# Patient Record
Sex: Male | Born: 1960 | Race: White | Hispanic: No
Health system: Southern US, Community
[De-identification: ages and names within clinical notes are randomized; demographics above are authoritative.]

## PROBLEM LIST (undated history)

## (undated) DIAGNOSIS — G9341 Metabolic encephalopathy: Secondary | ICD-10-CM

## (undated) DIAGNOSIS — J90 Pleural effusion, not elsewhere classified: Secondary | ICD-10-CM

## (undated) DIAGNOSIS — I469 Cardiac arrest, cause unspecified: Secondary | ICD-10-CM

## (undated) DIAGNOSIS — F1019 Alcohol abuse with unspecified alcohol-induced disorder: Secondary | ICD-10-CM

## (undated) DIAGNOSIS — J9621 Acute and chronic respiratory failure with hypoxia: Secondary | ICD-10-CM

---

## 2018-05-30 ENCOUNTER — Other Ambulatory Visit (HOSPITAL_COMMUNITY): Payer: Self-pay

## 2018-05-30 ENCOUNTER — Inpatient Hospital Stay
Admission: RE | Admit: 2018-05-30 | Discharge: 2018-07-11 | Disposition: E | Payer: Medicare Other | Source: Other Acute Inpatient Hospital | Attending: Internal Medicine | Admitting: Internal Medicine

## 2018-05-30 DIAGNOSIS — J9621 Acute and chronic respiratory failure with hypoxia: Secondary | ICD-10-CM | POA: Diagnosis present

## 2018-05-30 DIAGNOSIS — J9690 Respiratory failure, unspecified, unspecified whether with hypoxia or hypercapnia: Secondary | ICD-10-CM

## 2018-05-30 DIAGNOSIS — G9341 Metabolic encephalopathy: Secondary | ICD-10-CM | POA: Diagnosis present

## 2018-05-30 DIAGNOSIS — J90 Pleural effusion, not elsewhere classified: Secondary | ICD-10-CM | POA: Diagnosis present

## 2018-05-30 DIAGNOSIS — Z4659 Encounter for fitting and adjustment of other gastrointestinal appliance and device: Secondary | ICD-10-CM

## 2018-05-30 DIAGNOSIS — N170 Acute kidney failure with tubular necrosis: Secondary | ICD-10-CM

## 2018-05-30 DIAGNOSIS — I469 Cardiac arrest, cause unspecified: Secondary | ICD-10-CM | POA: Diagnosis present

## 2018-05-30 DIAGNOSIS — K567 Ileus, unspecified: Secondary | ICD-10-CM

## 2018-05-30 DIAGNOSIS — T85598A Other mechanical complication of other gastrointestinal prosthetic devices, implants and grafts, initial encounter: Secondary | ICD-10-CM

## 2018-05-30 DIAGNOSIS — K7460 Unspecified cirrhosis of liver: Secondary | ICD-10-CM

## 2018-05-30 DIAGNOSIS — F1019 Alcohol abuse with unspecified alcohol-induced disorder: Secondary | ICD-10-CM | POA: Diagnosis present

## 2018-05-30 DIAGNOSIS — R188 Other ascites: Secondary | ICD-10-CM

## 2018-05-30 HISTORY — DX: Acute and chronic respiratory failure with hypoxia: J96.21

## 2018-05-30 HISTORY — DX: Metabolic encephalopathy: G93.41

## 2018-05-30 HISTORY — DX: Cardiac arrest, cause unspecified: I46.9

## 2018-05-30 HISTORY — DX: Pleural effusion, not elsewhere classified: J90

## 2018-05-30 HISTORY — DX: Alcohol abuse with unspecified alcohol-induced disorder: F10.19

## 2018-05-31 ENCOUNTER — Other Ambulatory Visit (HOSPITAL_COMMUNITY): Payer: Self-pay

## 2018-05-31 DIAGNOSIS — G9341 Metabolic encephalopathy: Secondary | ICD-10-CM | POA: Diagnosis not present

## 2018-05-31 DIAGNOSIS — F1019 Alcohol abuse with unspecified alcohol-induced disorder: Secondary | ICD-10-CM | POA: Diagnosis not present

## 2018-05-31 DIAGNOSIS — J9621 Acute and chronic respiratory failure with hypoxia: Secondary | ICD-10-CM | POA: Diagnosis not present

## 2018-05-31 DIAGNOSIS — N170 Acute kidney failure with tubular necrosis: Secondary | ICD-10-CM | POA: Diagnosis not present

## 2018-05-31 LAB — CBC WITH DIFFERENTIAL/PLATELET
Abs Immature Granulocytes: 0.03 10*3/uL (ref 0.00–0.07)
Basophils Absolute: 0 10*3/uL (ref 0.0–0.1)
Basophils Relative: 0 %
Eosinophils Absolute: 0.1 10*3/uL (ref 0.0–0.5)
Eosinophils Relative: 1 %
HCT: 28.4 % — ABNORMAL LOW (ref 39.0–52.0)
Hemoglobin: 8.8 g/dL — ABNORMAL LOW (ref 13.0–17.0)
Immature Granulocytes: 1 %
Lymphocytes Relative: 14 %
Lymphs Abs: 0.7 10*3/uL (ref 0.7–4.0)
MCH: 30.2 pg (ref 26.0–34.0)
MCHC: 31 g/dL (ref 30.0–36.0)
MCV: 97.6 fL (ref 80.0–100.0)
Monocytes Absolute: 0.6 10*3/uL (ref 0.1–1.0)
Monocytes Relative: 11 %
Neutro Abs: 3.8 10*3/uL (ref 1.7–7.7)
Neutrophils Relative %: 73 %
Platelets: 93 10*3/uL — ABNORMAL LOW (ref 150–400)
RBC: 2.91 MIL/uL — ABNORMAL LOW (ref 4.22–5.81)
RDW: 17.4 % — ABNORMAL HIGH (ref 11.5–15.5)
WBC: 5.2 10*3/uL (ref 4.0–10.5)
nRBC: 0 % (ref 0.0–0.2)

## 2018-05-31 LAB — COMPREHENSIVE METABOLIC PANEL
ALT: 19 U/L (ref 0–44)
AST: 33 U/L (ref 15–41)
Albumin: 2.3 g/dL — ABNORMAL LOW (ref 3.5–5.0)
Alkaline Phosphatase: 115 U/L (ref 38–126)
Anion gap: 7 (ref 5–15)
BUN: 23 mg/dL — ABNORMAL HIGH (ref 6–20)
CO2: 31 mmol/L (ref 22–32)
Calcium: 8.8 mg/dL — ABNORMAL LOW (ref 8.9–10.3)
Chloride: 105 mmol/L (ref 98–111)
Creatinine, Ser: 1.13 mg/dL (ref 0.61–1.24)
GFR calc Af Amer: >60 mL/min (ref >60)
GFR calc non Af Amer: >60 mL/min (ref >60)
Glucose, Bld: 141 mg/dL — ABNORMAL HIGH (ref 70–99)
Potassium: 4.2 mmol/L (ref 3.5–5.1)
Sodium: 143 mmol/L (ref 135–145)
Total Bilirubin: 1.7 mg/dL — ABNORMAL HIGH (ref 0.3–1.2)
Total Protein: 5.7 g/dL — ABNORMAL LOW (ref 6.5–8.1)

## 2018-05-31 LAB — HEMOGLOBIN A1C
Hgb A1c MFr Bld: 5.7 % — ABNORMAL HIGH (ref 4.8–5.6)
Mean Plasma Glucose: 116.89 mg/dL

## 2018-05-31 LAB — MAGNESIUM: Magnesium: 1.2 mg/dL — ABNORMAL LOW (ref 1.7–2.4)

## 2018-05-31 LAB — AMMONIA: Ammonia: 56 umol/L — ABNORMAL HIGH (ref 9–35)

## 2018-05-31 LAB — C DIFFICILE QUICK SCREEN W PCR REFLEX
C Diff antigen: NEGATIVE
C Diff interpretation: NOT DETECTED
C Diff toxin: NEGATIVE

## 2018-05-31 LAB — PROTIME-INR
INR: 1.17
Prothrombin Time: 14.8 seconds (ref 11.4–15.2)

## 2018-05-31 LAB — PHOSPHORUS: Phosphorus: 2.4 mg/dL — ABNORMAL LOW (ref 2.5–4.6)

## 2018-05-31 LAB — TSH: TSH: 5.629 u[IU]/mL — ABNORMAL HIGH (ref 0.350–4.500)
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
## 2018-06-01 ENCOUNTER — Other Ambulatory Visit (HOSPITAL_COMMUNITY): Payer: Self-pay

## 2018-06-01 ENCOUNTER — Encounter: Payer: Self-pay | Admitting: Internal Medicine

## 2018-06-01 DIAGNOSIS — N170 Acute kidney failure with tubular necrosis: Secondary | ICD-10-CM | POA: Diagnosis not present

## 2018-06-01 DIAGNOSIS — G9341 Metabolic encephalopathy: Secondary | ICD-10-CM | POA: Diagnosis not present

## 2018-06-01 DIAGNOSIS — J9621 Acute and chronic respiratory failure with hypoxia: Secondary | ICD-10-CM | POA: Diagnosis not present

## 2018-06-01 DIAGNOSIS — F1019 Alcohol abuse with unspecified alcohol-induced disorder: Secondary | ICD-10-CM | POA: Diagnosis not present

## 2018-06-01 LAB — CBC
HCT: 29.4 % — ABNORMAL LOW (ref 39.0–52.0)
Hemoglobin: 8.9 g/dL — ABNORMAL LOW (ref 13.0–17.0)
MCH: 29.6 pg (ref 26.0–34.0)
MCHC: 30.3 g/dL (ref 30.0–36.0)
MCV: 97.7 fL (ref 80.0–100.0)
Platelets: 102 10*3/uL — ABNORMAL LOW (ref 150–400)
RBC: 3.01 MIL/uL — ABNORMAL LOW (ref 4.22–5.81)
RDW: 17.3 % — ABNORMAL HIGH (ref 11.5–15.5)
WBC: 3.6 10*3/uL — ABNORMAL LOW (ref 4.0–10.5)
nRBC: 0 % (ref 0.0–0.2)

## 2018-06-01 LAB — MAGNESIUM: Magnesium: 1.7 mg/dL (ref 1.7–2.4)

## 2018-06-01 LAB — RENAL FUNCTION PANEL
Albumin: 2.4 g/dL — ABNORMAL LOW (ref 3.5–5.0)
Anion gap: 8 (ref 5–15)
BUN: 24 mg/dL — ABNORMAL HIGH (ref 6–20)
CO2: 31 mmol/L (ref 22–32)
Calcium: 8.7 mg/dL — ABNORMAL LOW (ref 8.9–10.3)
Chloride: 103 mmol/L (ref 98–111)
Creatinine, Ser: 1.24 mg/dL (ref 0.61–1.24)
GFR calc Af Amer: >60 mL/min (ref >60)
GFR calc non Af Amer: >60 mL/min (ref >60)
Glucose, Bld: 194 mg/dL — ABNORMAL HIGH (ref 70–99)
PHOSPHORUS: 3.7 mg/dL (ref 2.5–4.6)
POTASSIUM: 4 mmol/L (ref 3.5–5.1)
Sodium: 142 mmol/L (ref 135–145)

## 2018-06-01 NOTE — Progress Notes (Signed)
[facility] [place]   PULMONARY CRITICAL CARE SERVICE  PROGRESS NOTE  Date of Service: 06/01/2018  Nathan RathkeJeffrey D Urick  UJW:[id number]  DOB: 1960-07-11   DOA: 05/21/2018  Referring Physician: Carron CurieAli Hijazi, MD  HPI: Nathan Short is a 57 y.o. male seen for follow up of Acute on Chronic Respiratory Failure.  Patient has been attempted on weaning but has not been tolerating it.  Is currently on assist control oxygen was decreased down to 40%.  Medications: Reviewed on Rounds  Physical Exam:  Vitals: Temperature 97.5 pulse 80 respiratory rate 12 blood pressure 114/66 saturations 96%  Ventilator Settings mode of ventilation assist control FiO2 40% tidal volume is 400 PEEP 5  . General: Comfortable at this time . Eyes: Grossly normal lids, irises & conjunctiva . ENT: grossly tongue is normal . Neck: no obvious mass . Cardiovascular: S1 S2 normal no gallop . Respiratory: Coarse breath sounds with few scattered rhonchi . Abdomen: soft . Skin: no rash seen on limited exam . Musculoskeletal: not rigid . Psychiatric:unable to assess . Neurologic: no seizure no involuntary movements         Lab Data:   Basic Metabolic Panel: Recent Labs  Lab 05/31/18 0712 06/01/18 0646  NA 143 142  K 4.2 4.0  CL 105 103  CO2 31 31  GLUCOSE 141* 194*  BUN 23* 24*  CREATININE 1.13 1.24  CALCIUM 8.8* 8.7*  MG 1.2* 1.7  PHOS 2.4* 3.7    ABG: No results for input(s): PHART, PCO2ART, PO2ART, HCO3, O2SAT in the last 168 hours.  Liver Function Tests: Recent Labs  Lab 05/31/18 0712 06/01/18 0646  AST 33  --   ALT 19  --   ALKPHOS 115  --   BILITOT 1.7*  --   PROT 5.7*  --   ALBUMIN 2.3* 2.4*   No results for input(s): LIPASE, AMYLASE in the last 168 hours. Recent Labs  Lab 05/31/18 0712  AMMONIA 56*    CBC: Recent Labs  Lab 05/31/18 0712 06/01/18 0646  WBC 5.2 3.6*  NEUTROABS 3.8  --   HGB 8.8* 8.9*  HCT 28.4* 29.4*  MCV  97.6 97.7  PLT 93* 102*    Cardiac Enzymes: No results for input(s): CKTOTAL, CKMB, CKMBINDEX, TROPONINI in the last 168 hours.  BNP (last 3 results) No results for input(s): BNP in the last 8760 hours.  ProBNP (last 3 results) No results for input(s): PROBNP in the last 8760 hours.  Radiological Exams: Kore[place] Abdomen Complete  Result Date: 05/31/2018 CLINICAL DATA:  Cirrhosis.  Acute respiratory failure. EXAM: ABDOMEN ULTRASOUND COMPLETE COMPARISON:  None. FINDINGS: Degradation secondary to morbid obesity and being on ventilator. Gallbladder: 1.4 cm gallstone. No wall thickening or pericholecystic fluid. Sonographic Murphy's sign was not elicited. Common bile duct: Diameter: Normal, 4 mm. Liver: Increased echogenicity. Mildly irregular hepatic capsule. No focal liver lesion. Portal vein is patent on color Doppler imaging with normal direction of blood flow towards the liver. IVC: No abnormality visualized. Pancreas: Poorly visualized due to overlying bowel gas. Spleen: Size and appearance within normal limits. Right Kidney: Length: 10.6 cm. Echogenicity within normal limits. No mass or hydronephrosis visualized. Left Kidney: Not visualized Abdominal aorta: No aneurysm visualized. Other findings: Small volume perihepatic ascites. This may be complex, including on image 27. IMPRESSION: 1. Decreased sensitivity and specificity exam due to technique related factors, as described above. 2. Increased hepatic echogenicity and irregular hepatic capsule, suspicious for cirrhosis. 3. Cholelithiasis. 4. Small  volume perihepatic ascites. Cannot exclude complex ascites as can be seen with infection. Electronically Signed   By: Jeronimo GreavesKyle  Talbot M.D.   On: 05/31/2018 02:17   Dg Chest Port 1 View  Result Date: 05/16/2018 CLINICAL DATA:  Tracheostomy EXAM: PORTABLE CHEST 1 VIEW COMPARISON:  None. FINDINGS: Tracheostomy tube tip is about 3 cm superior to the carina. Subsegmental atelectasis at the right lung base.  Probable left pleural effusion. Borderline to mild cardiomegaly with vascular congestion and mild pulmonary edema. Dense airspace disease at the left lung base. Esophageal tube tip extends below diaphragm but is non included IMPRESSION: 1. Tracheostomy tube with the tip about 3 cm superior to carina 2. Left pleural effusion. Borderline cardiomegaly with vascular congestion and mild edema. 3. Dense left lung base atelectasis or pneumonia. Platelike atelectasis in the right lower lung Electronically Signed   By: Jasmine PangKim  Fujinaga M.D.   On: 05/26/2018 20:32   Dg Abd Portable 1v  Result Date: 06/01/2018 CLINICAL DATA:  Umbilical pain and abdominal distention. EXAM: PORTABLE ABDOMEN - 1 VIEW COMPARISON:  None. FINDINGS:.: FINDINGS:. Enteric tube tip is in the stomach. The tip projects over the gastric antrum. A few borderline enlarged small bowel loops noted within the right abdomen. The degree of bowel distention is improved from 05/16/2018. Gas is noted throughout the colon up to the rectum no radio-opaque calculi or other significant radiographic abnormality are seen. IMPRESSION: Mildly distended loops of small bowel in the right hemiabdomen. The overall degree of small-bowel distention is improved when compared with the previous exam. Electronically Signed   By: Signa Kellaylor  Stroud M.D.   On: 06/01/2018 13:48   Dg Abd Portable 1v  Result Date: 06/08/2018 CLINICAL DATA:  NG tube placement EXAM: PORTABLE ABDOMEN - 1 VIEW COMPARISON:  None. FINDINGS: Esophageal tube tip and side-port project over the proximal stomach. Dilated small bowel in the left upper quadrant measuring up to 4.5 cm. Evidence of hernia repair. IMPRESSION: Esophageal tube tip and side-port project over the proximal stomach Electronically Signed   By: Jasmine PangKim  Fujinaga M.D.   On: 05/18/2018 20:33    Assessment/Plan Active Problems:   Acute on chronic respiratory failure with hypoxia (HCC)   Acute metabolic encephalopathy   Cardiac arrest (HCC)    Pleural effusion   Acute renal failure with acute tubular necrosis superimposed on chronic kidney disease, on chronic dialysis (HCC)   Alcohol abuse with alcohol-induced disorder (HCC)   1. Acute on chronic respiratory failure with hypoxia as noted above patient has been attempted on weaning trials but is not tolerating them.  Respiratory therapy will continue to do the SBT.  Will assess daily continue with supportive care 2. Pleural effusion chest x-ray shows a pleural effusion on the left side I have asked for a CT scan to be done to further evaluate. 3. Acute metabolic encephalopathy little bit better we will continue to monitor closely. 4. Cardiac arrest rhythm is been stable we will continue to monitor. 5. Acute renal failure follow-up on labs 6. Alcohol abuse no active withdrawal is noted at this time.   I have personally seen and evaluated the patient, evaluated laboratory and imaging results, formulated the assessment and plan and placed orders. The Patient requires high complexity decision making for assessment and support.  Case was discussed on Rounds with the Respiratory Therapy Staff  Yevonne PaxSaadat A Khan, MD [facility]ahFCCP Pulmonary Critical Care Medicine Sleep Medicine

## 2018-06-02 ENCOUNTER — Other Ambulatory Visit (HOSPITAL_COMMUNITY): Payer: Self-pay

## 2018-06-02 DIAGNOSIS — G9341 Metabolic encephalopathy: Secondary | ICD-10-CM | POA: Diagnosis not present

## 2018-06-02 DIAGNOSIS — F1019 Alcohol abuse with unspecified alcohol-induced disorder: Secondary | ICD-10-CM

## 2018-06-02 DIAGNOSIS — N170 Acute kidney failure with tubular necrosis: Secondary | ICD-10-CM | POA: Diagnosis not present

## 2018-06-02 DIAGNOSIS — Z992 Dependence on renal dialysis: Secondary | ICD-10-CM

## 2018-06-02 DIAGNOSIS — J90 Pleural effusion, not elsewhere classified: Secondary | ICD-10-CM

## 2018-06-02 DIAGNOSIS — J9621 Acute and chronic respiratory failure with hypoxia: Secondary | ICD-10-CM

## 2018-06-02 DIAGNOSIS — I469 Cardiac arrest, cause unspecified: Secondary | ICD-10-CM

## 2018-06-02 DIAGNOSIS — N189 Chronic kidney disease, unspecified: Secondary | ICD-10-CM

## 2018-06-02 LAB — CULTURE, RESPIRATORY W GRAM STAIN

## 2018-06-02 LAB — CULTURE, RESPIRATORY

## 2018-06-02 NOTE — Progress Notes (Signed)
[facility]   PULMONARY CRITICAL CARE SERVICE  PROGRESS NOTE  Date of Service: 06/02/2018  Nathan Short  AVW:[id number]  DOB: 04/08/1961   DOA: 05/13/2018  Referring Physician: Carron CurieAli Hijazi, MD  HPI: Nathan Short is a 57 y.o. male seen for follow up of Acute on Chronic Respiratory Failure.  Patient is on full support has been on pressure support weaning today is back on the pressure support goal of 6 hours  Medications: Reviewed on Rounds  Physical Exam:  Vitals: Pitcher 97.6 pulse 110 respiratory 19 blood pressure 118/70 saturations 97%  Ventilator Settings pressure support FiO2 28% tidal volume 500 pressure support 12 PEEP 5  . General: Comfortable at this time . Eyes: Grossly normal lids, irises & conjunctiva . ENT: grossly tongue is normal . Neck: no obvious mass . Cardiovascular: S1 S2 normal no gallop . Respiratory: No rhonchi no rales are noted at this time . Abdomen: soft . Skin: no rash seen on limited exam . Musculoskeletal: not rigid . Psychiatric:unable to assess . Neurologic: no seizure no involuntary movements         Lab Data:   Basic Metabolic Panel: Recent Labs  Lab 05/31/18 0712 06/01/18 0646  NA 143 142  K 4.2 4.0  CL 105 103  CO2 31 31  GLUCOSE 141* 194*  BUN 23* 24*  CREATININE 1.13 1.24  CALCIUM 8.8* 8.7*  MG 1.2* 1.7  PHOS 2.4* 3.7    ABG: No results for input(s): PHART, PCO2ART, PO2ART, HCO3, O2SAT in the last 168 hours.  Liver Function Tests: Recent Labs  Lab 05/31/18 0712 06/01/18 0646  AST 33  --   ALT 19  --   ALKPHOS 115  --   BILITOT 1.7*  --   PROT 5.7*  --   ALBUMIN 2.3* 2.4*   No results for input(s): LIPASE, AMYLASE in the last 168 hours. Recent Labs  Lab 05/31/18 0712  AMMONIA 56*    CBC: Recent Labs  Lab 05/31/18 0712 06/01/18 0646  WBC 5.2 3.6*  NEUTROABS 3.8  --   HGB 8.8* 8.9*  HCT 28.4* 29.4*  MCV 97.6 97.7  PLT 93* 102*     Cardiac Enzymes: No results for input(s): CKTOTAL, CKMB, CKMBINDEX, TROPONINI in the last 168 hours.  BNP (last 3 results) No results for input(s): BNP in the last 8760 hours.  ProBNP (last 3 results) No results for input(s): PROBNP in the last 8760 hours.  Radiological Exams: Ct Chest Wo Contrast  Result Date: 06/02/2018 CLINICAL DATA:  Follow-up pleural effusion EXAM: CT CHEST WITHOUT CONTRAST TECHNIQUE: Multidetector CT imaging of the chest was performed following the standard protocol without IV contrast. COMPARISON:  05/14/2018 FINDINGS: Cardiovascular: Somewhat limited due to lack of IV contrast. Atherosclerotic calcifications are noted as well as coronary calcifications. No cardiac enlargement is seen. No aneurysmal dilatation of the aorta is noted. Mediastinum/Nodes: Thoracic inlet is within normal limits. Tracheostomy tube is noted in satisfactory position. Nasogastric catheter courses into the stomach. No sizable hilar or mediastinal adenopathy is noted. Lungs/Pleura: Patchy consolidation is noted in the right upper and right lower lobes with small pleural effusion. Large left-sided pleural effusion is noted similar to that seen on recent plain film with associated lower lobe consolidation. Upper Abdomen: Cirrhotic change of the liver is noted. Underlying ascites and splenomegaly is noted as well. Musculoskeletal: Degenerative changes of the thoracic spine are noted. IMPRESSION: Bilateral areas of consolidation right greater than left. Bilateral pleural  effusions left greater than right. 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 Electronically Signed   By: Alcide CleverMark  Lukens M.D.   On: 06/02/2018 02:31   Dg Abd Portable 1v  Result Date: 06/01/2018 CLINICAL DATA:  Umbilical pain and abdominal distention. EXAM: PORTABLE ABDOMEN - 1 VIEW COMPARISON:  None. FINDINGS:.: FINDINGS:. Enteric tube tip is in the stomach. The tip projects over the gastric antrum. A few borderline  enlarged small bowel loops noted within the right abdomen. The degree of bowel distention is improved from 2018-04-06. Gas is noted throughout the colon up to the rectum no radio-opaque calculi or other significant radiographic abnormality are seen. IMPRESSION: Mildly distended loops of small bowel in the right hemiabdomen. The overall degree of small-bowel distention is improved when compared with the previous exam. Electronically Signed   By: Signa Kellaylor  Stroud M.D.   On: 06/01/2018 13:48    Assessment/Plan Active Problems:   Acute on chronic respiratory failure with hypoxia (HCC)   Acute metabolic encephalopathy   Cardiac arrest (HCC)   Pleural effusion   Acute renal failure with acute tubular necrosis superimposed on chronic kidney disease, on chronic dialysis (HCC)   Alcohol abuse with alcohol-induced disorder (HCC)   1. Acute on chronic respiratory failure with hypoxia at this time we will continue with weaning on pressure support mode the goal is 6 hours as noted.  We will continue aggressive pulmonary toilet supportive care. 2. Pleural effusion patient will likely need thoracentesis.  Discussed with the primary care team during rounds. 3. Cardiac arrest rhythm is stable we will continue to monitor. 4. Acute renal failure follow-up on labs as noted 5. Alcohol abuse no active withdrawal is noted at this time. 6. Metabolic encephalopathy patient is doing about the same we will continue to monitor   I have personally seen and evaluated the patient, evaluated laboratory and imaging results, formulated the assessment and plan and placed orders. The Patient requires high complexity decision making for assessment and support.  Case was discussed on Rounds with the Respiratory Therapy Staff  Yevonne PaxSaadat A Ivan Maskell, MD [facility] BayFCCP Pulmonary Critical Care Medicine Sleep Medicine

## 2018-06-03 ENCOUNTER — Other Ambulatory Visit (HOSPITAL_COMMUNITY): Payer: Self-pay

## 2018-06-03 DIAGNOSIS — J9621 Acute and chronic respiratory failure with hypoxia: Secondary | ICD-10-CM | POA: Diagnosis not present

## 2018-06-03 DIAGNOSIS — N170 Acute kidney failure with tubular necrosis: Secondary | ICD-10-CM | POA: Diagnosis not present

## 2018-06-03 DIAGNOSIS — F1019 Alcohol abuse with unspecified alcohol-induced disorder: Secondary | ICD-10-CM | POA: Diagnosis not present

## 2018-06-03 DIAGNOSIS — G9341 Metabolic encephalopathy: Secondary | ICD-10-CM | POA: Diagnosis not present

## 2018-06-03 LAB — RENAL FUNCTION PANEL
ALBUMIN: 2.3 g/dL — AB (ref 3.5–5.0)
Anion gap: 11 (ref 5–15)
BUN: 21 mg/dL — ABNORMAL HIGH (ref 6–20)
CO2: 31 mmol/L (ref 22–32)
Calcium: 8.4 mg/dL — ABNORMAL LOW (ref 8.9–10.3)
Chloride: 100 mmol/L (ref 98–111)
Creatinine, Ser: 1 mg/dL (ref 0.61–1.24)
GFR calc Af Amer: >60 mL/min (ref >60)
GFR calc non Af Amer: >60 mL/min (ref >60)
GLUCOSE: 212 mg/dL — AB (ref 70–99)
Phosphorus: 3.2 mg/dL (ref 2.5–4.6)
Potassium: 3.7 mmol/L (ref 3.5–5.1)
Sodium: 142 mmol/L (ref 135–145)

## 2018-06-03 LAB — MAGNESIUM: Magnesium: 1.6 mg/dL — ABNORMAL LOW (ref 1.7–2.4)

## 2018-06-03 LAB — CBC
HCT: 29 % — ABNORMAL LOW (ref 39.0–52.0)
Hemoglobin: 8.8 g/dL — ABNORMAL LOW (ref 13.0–17.0)
MCH: 29.5 pg (ref 26.0–34.0)
MCHC: 30.3 g/dL (ref 30.0–36.0)
MCV: 97.3 fL (ref 80.0–100.0)
Platelets: 113 10*3/uL — ABNORMAL LOW (ref 150–400)
RBC: 2.98 MIL/uL — ABNORMAL LOW (ref 4.22–5.81)
RDW: 17.1 % — AB (ref 11.5–15.5)
WBC: 3.4 10*3/uL — AB (ref 4.0–10.5)
nRBC: 0 % (ref 0.0–0.2)

## 2018-06-03 NOTE — Progress Notes (Signed)
[facility]ulmonary Critical Care [facility]   PULMONARY CRITICAL CARE SERVICE  PROGRESS NOTE  Date of Service: 06/03/2018  Nathan Short  JYN:[id number]  DOB: [birth year]   DOA: 05/29/2018  Referring Physician: Carron CurieAli Hijazi, MD  HPI: Nathan Short is a 57 y.o. male seen for follow up of Acute on Chronic Respiratory Failure.  Patient remains on pressure support has been gradually weaning currently is on 28% oxygen with good saturations  Medications: Reviewed on Rounds  Physical Exam:  Vitals: Temperature 97.2 pulse 108 respiratory 23 blood pressure 130/87 saturations 100%  Ventilator Settings mode ventilation pressure support FiO2 28% pressure support 12 PEEP 5  . General: Comfortable at this time . Eyes: Grossly normal lids, irises & conjunctiva . ENT: grossly tongue is normal . Neck: no obvious mass . Cardiovascular: S1 S2 normal no gallop . Respiratory: Coarse breath sounds with few rhonchi no rales are noted at this time . Abdomen: soft . Skin: no rash seen on limited exam . Musculoskeletal: not rigid . Psychiatric:unable to assess . Neurologic: no seizure no involuntary movements         Lab Data:   Basic Metabolic Panel: Recent Labs  Lab 05/31/18 0712 06/01/18 0646 06/03/18 0654  NA 143 142 142  K 4.2 4.0 3.7  CL 105 103 100  CO2 31 31 31   GLUCOSE 141* 194* 212*  BUN 23* 24* 21*  CREATININE 1.13 1.24 1.00  CALCIUM 8.8* 8.7* 8.4*  MG 1.2* 1.7 1.6*  PHOS 2.4* 3.7 3.2    ABG: No results for input(s): PHART, PCO2ART, PO2ART, HCO3, O2SAT in the last 168 hours.  Liver Function Tests: Recent Labs  Lab 05/31/18 0712 06/01/18 0646 06/03/18 0654  AST 33  --   --   ALT 19  --   --   ALKPHOS 115  --   --   BILITOT 1.7*  --   --   PROT 5.7*  --   --   ALBUMIN 2.3* 2.4* 2.3*   No results for input(s): LIPASE, AMYLASE in the last 168 hours. Recent Labs  Lab 05/31/18 0712  AMMONIA 56*    CBC: Recent Labs  Lab  05/31/18 0712 06/01/18 0646 06/03/18 0654  WBC 5.2 3.6* 3.4*  NEUTROABS 3.8  --   --   HGB 8.8* 8.9* 8.8*  HCT 28.4* 29.4* 29.0*  MCV 97.6 97.7 97.3  PLT 93* 102* 113*    Cardiac Enzymes: No results for input(s): CKTOTAL, CKMB, CKMBINDEX, TROPONINI in the last 168 hours.  BNP (last 3 results) No results for input(s): BNP in the last 8760 hours.  ProBNP (last 3 results) No results for input(s): PROBNP in the last 8760 hours.  Radiological Exams: Ct Chest Wo Contrast  Result Date: 06/02/2018 CLINICAL DATA:  Follow-up pleural effusion EXAM: CT CHEST WITHOUT CONTRAST TECHNIQUE: Multidetector CT imaging of the chest was performed following the standard protocol without IV contrast. COMPARISON:  05/11/2018 FINDINGS: Cardiovascular: Somewhat limited due to lack of IV contrast. Atherosclerotic calcifications are noted as well as coronary calcifications. No cardiac enlargement is seen. No aneurysmal dilatation of the aorta is noted. Mediastinum/Nodes: Thoracic inlet is within normal limits. Tracheostomy tube is noted in satisfactory position. Nasogastric catheter courses into the stomach. No sizable hilar or mediastinal adenopathy is noted. Lungs/Pleura: Patchy consolidation is noted in the right upper and right lower lobes with small pleural effusion. Large left-sided pleural effusion is noted similar to that seen on recent plain film with associated lower lobe  consolidation. Upper Abdomen: Cirrhotic change of the liver is noted. Underlying ascites and splenomegaly is noted as well. Musculoskeletal: Degenerative changes of the thoracic spine are noted. IMPRESSION: Bilateral areas of consolidation right greater than left. Bilateral pleural effusions left greater than right. 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 Electronically Signed   By: Alcide CleverMark  Lukens M.D.   On: 06/02/2018 02:31   Dg Abd Portable 1v  Result Date: 06/03/2018 CLINICAL DATA:  Check gastric catheter placement  EXAM: PORTABLE ABDOMEN - 1 VIEW COMPARISON:  06/01/2018 FINDINGS: Gastric catheter is noted within the distal aspect of the stomach. The overall appearance is stable from the previous exam. IMPRESSION: Gastric catheter in satisfactory position. Electronically Signed   By: Alcide CleverMark  Lukens M.D.   On: 06/03/2018 02:30   Dg Abd Portable 1v  Result Date: 06/01/2018 CLINICAL DATA:  Umbilical pain and abdominal distention. EXAM: PORTABLE ABDOMEN - 1 VIEW COMPARISON:  None. FINDINGS:.: FINDINGS:. Enteric tube tip is in the stomach. The tip projects over the gastric antrum. A few borderline enlarged small bowel loops noted within the right abdomen. The degree of bowel distention is improved from 11/11/17. Gas is noted throughout the colon up to the rectum no radio-opaque calculi or other significant radiographic abnormality are seen. IMPRESSION: Mildly distended loops of small bowel in the right hemiabdomen. The overall degree of small-bowel distention is improved when compared with the previous exam. Electronically Signed   By: Signa Kellaylor  Stroud M.D.   On: 06/01/2018 13:48    Assessment/Plan Active Problems:   Acute on chronic respiratory failure with hypoxia (HCC)   Acute metabolic encephalopathy   Cardiac arrest (HCC)   Pleural effusion   Acute renal failure with acute tubular necrosis superimposed on chronic kidney disease, on chronic dialysis (HCC)   Alcohol abuse with alcohol-induced disorder (HCC)   1. Acute on chronic respiratory failure with hypoxia patient is weaning on protocol right now tolerating pressure support will continue to advance time and pressure support mode. 2. Pleural effusion as discussed may need thoracentesis.  We will continue to monitor. 3. Cardiac arrest rhythm is been stable 4. Metabolic encephalopathy unchanged we will continue to monitor 5. Acute renal failure labs have normalized we will continue with supportive care we will follow fluid status. 6. Alcohol abuse no active  withdrawal is noted we will continue to monitor closely   I have personally seen and evaluated the patient, evaluated laboratory and imaging results, formulated the assessment and plan and placed orders. The Patient requires high complexity decision making for assessment and support.  Case was discussed on Rounds with the Respiratory Therapy Staff  Yevonne PaxSaadat A Khan, MD [facility] 352FCCP Pulmonary Critical Care Medicine Sleep Medicine

## 2018-06-04 ENCOUNTER — Encounter: Payer: Self-pay | Admitting: Internal Medicine

## 2018-06-04 ENCOUNTER — Other Ambulatory Visit (HOSPITAL_COMMUNITY): Payer: Self-pay

## 2018-06-04 DIAGNOSIS — N170 Acute kidney failure with tubular necrosis: Secondary | ICD-10-CM | POA: Diagnosis not present

## 2018-06-04 DIAGNOSIS — F1019 Alcohol abuse with unspecified alcohol-induced disorder: Secondary | ICD-10-CM | POA: Diagnosis not present

## 2018-06-04 DIAGNOSIS — J90 Pleural effusion, not elsewhere classified: Secondary | ICD-10-CM | POA: Diagnosis present

## 2018-06-04 DIAGNOSIS — Z992 Dependence on renal dialysis: Secondary | ICD-10-CM | POA: Insufficient documentation

## 2018-06-04 DIAGNOSIS — N189 Chronic kidney disease, unspecified: Secondary | ICD-10-CM

## 2018-06-04 DIAGNOSIS — G9341 Metabolic encephalopathy: Secondary | ICD-10-CM | POA: Diagnosis present

## 2018-06-04 DIAGNOSIS — J9621 Acute and chronic respiratory failure with hypoxia: Secondary | ICD-10-CM | POA: Diagnosis not present

## 2018-06-04 DIAGNOSIS — I469 Cardiac arrest, cause unspecified: Secondary | ICD-10-CM | POA: Diagnosis present

## 2018-06-04 LAB — BODY FLUID CELL COUNT WITH DIFFERENTIAL
Eos, Fluid: 0 %
Lymphs, Fluid: 20 %
Monocyte-Macrophage-Serous Fluid: 64 % (ref 50–90)
Neutrophil Count, Fluid: 16 % (ref 0–25)
Total Nucleated Cell Count, Fluid: 69 cu mm (ref 0–1000)

## 2018-06-04 LAB — GRAM STAIN

## 2018-06-04 LAB — RENAL FUNCTION PANEL
Albumin: 2.4 g/dL — ABNORMAL LOW (ref 3.5–5.0)
Anion gap: 12 (ref 5–15)
BUN: 18 mg/dL (ref 6–20)
CALCIUM: 8.5 mg/dL — AB (ref 8.9–10.3)
CO2: 29 mmol/L (ref 22–32)
Chloride: 100 mmol/L (ref 98–111)
Creatinine, Ser: 0.99 mg/dL (ref 0.61–1.24)
GFR calc Af Amer: >60 mL/min (ref >60)
GFR calc non Af Amer: >60 mL/min (ref >60)
Glucose, Bld: 233 mg/dL — ABNORMAL HIGH (ref 70–99)
Phosphorus: 3.3 mg/dL (ref 2.5–4.6)
Potassium: 4.3 mmol/L (ref 3.5–5.1)
SODIUM: 141 mmol/L (ref 135–145)

## 2018-06-04 LAB — PROTEIN, PLEURAL OR PERITONEAL FLUID: Total protein, fluid: <3 g/dL

## 2018-06-04 LAB — MAGNESIUM: Magnesium: 1.7 mg/dL (ref 1.7–2.4)

## 2018-06-04 LAB — GLUCOSE, PLEURAL OR PERITONEAL FLUID: Glucose, Fluid: 228 mg/dL

## 2018-06-04 LAB — AMMONIA: Ammonia: 66 umol/L — ABNORMAL HIGH (ref 9–35)

## 2018-06-04 MED ORDER — LIDOCAINE HCL (PF) 1 % IJ SOLN
INTRAMUSCULAR | Status: AC
  Filled 2018-06-04: qty 30

## 2018-06-04 NOTE — Procedures (Signed)
PROCEDURE SUMMARY:  Successful image-guided paracentesis from the right lower abdomen.  Yielded 9.6 liters of clear yellow fluid.  No immediate complications.  EBL: zero Patient tolerated well.   Specimen was sent for labs.  Please see imaging section of Epic for full dictation.  Villa HerbShannon A Kempton Milne PA-C 06/04/2018 3:21 PM

## 2018-06-04 NOTE — Progress Notes (Addendum)
Pulmonary Critical Care [facility]   PULMONARY CRITICAL CARE SERVICE  PROGRESS NOTE  Date of Service: 06/04/2018  Nathan RathkeJeffrey D Short  ZOX:[id number]  DOB: 1960/08/28   DOA: 06/02/2018  Referring Physician: Carron CurieAli Hijazi, MD  HPI: Nathan Short is a 57 y.o. male seen for follow up of Acute on Chronic Respiratory Failure.  Patient is comfortable right now on pressure support the goal is for 16 hours  Medications: Reviewed on Rounds  Physical Exam:  Vitals: Temperature 97.5 pulse 104 respiratory 18 blood pressure 137/75 saturations are 100%  Ventilator Settings mode ventilation pressure support FiO2 28% tidal volume 512 per support 12 PEEP 5  . General: Comfortable at this time . Eyes: Grossly normal lids, irises & conjunctiva . ENT: grossly tongue is normal . Neck: no obvious mass . Cardiovascular: S1 S2 normal no gallop . Respiratory: Coarse breath sounds with few rhonchi . Abdomen: soft . Skin: no rash seen on limited exam . Musculoskeletal: not rigid . Psychiatric:unable to assess . Neurologic: no seizure no involuntary movements         Lab Data:   Basic Metabolic Panel: Recent Labs  Lab 05/31/18 0712 06/01/18 0646 06/03/18 0654 06/04/18 0422  NA 143 142 142 141  K 4.2 4.0 3.7 4.3  CL 105 103 100 100  CO2 31 31 31 29   GLUCOSE 141* 194* 212* 233*  BUN 23* 24* 21* 18  CREATININE 1.13 1.24 1.00 0.99  CALCIUM 8.8* 8.7* 8.4* 8.5*  MG 1.2* 1.7 1.6* 1.7  PHOS 2.4* 3.7 3.2 3.3    ABG: No results for input(s): PHART, PCO2ART, PO2ART, HCO3, O2SAT in the last 168 hours.  Liver Function Tests: Recent Labs  Lab 05/31/18 0712 06/01/18 0646 06/03/18 0654 06/04/18 0422  AST 33  --   --   --   ALT 19  --   --   --   ALKPHOS 115  --   --   --   BILITOT 1.7*  --   --   --   PROT 5.7*  --   --   --   ALBUMIN 2.3* 2.4* 2.3* 2.4*   No results for input(s): LIPASE, AMYLASE in the last 168 hours. Recent Labs  Lab 05/31/18 0712  06/04/18 0422  AMMONIA 56* 66*    CBC: Recent Labs  Lab 05/31/18 0712 06/01/18 0646 06/03/18 0654  WBC 5.2 3.6* 3.4*  NEUTROABS 3.8  --   --   HGB 8.8* 8.9* 8.8*  HCT 28.4* 29.4* 29.0*  MCV 97.6 97.7 97.3  PLT 93* 102* 113*    Cardiac Enzymes: No results for input(s): CKTOTAL, CKMB, CKMBINDEX, TROPONINI in the last 168 hours.  BNP (last 3 results) No results for input(s): BNP in the last 8760 hours.  ProBNP (last 3 results) No results for input(s): PROBNP in the last 8760 hours.  Radiological Exams: Dg Abd Portable 1v  Result Date: 06/03/2018 CLINICAL DATA:  Check gastric catheter placement EXAM: PORTABLE ABDOMEN - 1 VIEW COMPARISON:  06/01/2018 FINDINGS: Gastric catheter is noted within the distal aspect of the stomach. The overall appearance is stable from the previous exam. IMPRESSION: Gastric catheter in satisfactory position. Electronically Signed   By: Alcide CleverMark  Lukens M.D.   On: 06/03/2018 02:30    Assessment/Plan Active Problems:   Acute on chronic respiratory failure with hypoxia (HCC)   Acute metabolic encephalopathy   Cardiac arrest (HCC)   Pleural effusion   Acute renal failure with acute tubular necrosis superimposed on  chronic kidney disease, on chronic dialysis (HCC)   Alcohol abuse with alcohol-induced disorder (HCC)   1. Acute on chronic respiratory failure with hypoxia we will continue with pressure support wean goal is 16 hours as already noted. 2. Acute metabolic encephalopathy grossly unchanged we will continue with present management 3. Cardiac arrest rhythm is stable at this time. 4. Pleural effusion stable we will continue with present management 5. Acute renal failure with tubular necrosis labs have normalized 6. Alcohol abuse no active withdrawal noted at this time   I have personally seen and evaluated the patient, evaluated laboratory and imaging results, formulated the assessment and plan and placed orders. The Patient requires high  complexity decision making for assessment and support.  Case was discussed on Rounds with the Respiratory Therapy Staff  Yevonne PaxSaadat A Khan, MD [facility] Pulmonary Critical Care Medicine Sleep Medicine

## 2018-06-05 DIAGNOSIS — G9341 Metabolic encephalopathy: Secondary | ICD-10-CM | POA: Diagnosis not present

## 2018-06-05 DIAGNOSIS — F1019 Alcohol abuse with unspecified alcohol-induced disorder: Secondary | ICD-10-CM | POA: Diagnosis not present

## 2018-06-05 DIAGNOSIS — N170 Acute kidney failure with tubular necrosis: Secondary | ICD-10-CM | POA: Diagnosis not present

## 2018-06-05 DIAGNOSIS — J9621 Acute and chronic respiratory failure with hypoxia: Secondary | ICD-10-CM | POA: Diagnosis not present

## 2018-06-05 LAB — RENAL FUNCTION PANEL
Albumin: 2.1 g/dL — ABNORMAL LOW (ref 3.5–5.0)
Anion gap: 10 (ref 5–15)
BUN: 17 mg/dL (ref 6–20)
CO2: 32 mmol/L (ref 22–32)
Calcium: 8.4 mg/dL — ABNORMAL LOW (ref 8.9–10.3)
Chloride: 101 mmol/L (ref 98–111)
Creatinine, Ser: 0.93 mg/dL (ref 0.61–1.24)
GFR calc Af Amer: >60 mL/min (ref >60)
GFR calc non Af Amer: >60 mL/min (ref >60)
Glucose, Bld: 240 mg/dL — ABNORMAL HIGH (ref 70–99)
POTASSIUM: 3.4 mmol/L — AB (ref 3.5–5.1)
Phosphorus: 3.4 mg/dL (ref 2.5–4.6)
Sodium: 143 mmol/L (ref 135–145)

## 2018-06-05 LAB — PH, BODY FLUID: pH, Body Fluid: 7.7

## 2018-06-05 LAB — MAGNESIUM: Magnesium: 1.7 mg/dL (ref 1.7–2.4)

## 2018-06-05 LAB — CBC
HCT: 32.2 % — ABNORMAL LOW (ref 39.0–52.0)
HEMOGLOBIN: 9.7 g/dL — AB (ref 13.0–17.0)
MCH: 29.8 pg (ref 26.0–34.0)
MCHC: 30.1 g/dL (ref 30.0–36.0)
MCV: 99.1 fL (ref 80.0–100.0)
NRBC: 0 % (ref 0.0–0.2)
Platelets: 130 10*3/uL — ABNORMAL LOW (ref 150–400)
RBC: 3.25 MIL/uL — ABNORMAL LOW (ref 4.22–5.81)
RDW: 17 % — ABNORMAL HIGH (ref 11.5–15.5)
WBC: 3.3 10*3/uL — ABNORMAL LOW (ref 4.0–10.5)

## 2018-06-05 LAB — AMMONIA: Ammonia: 45 umol/L — ABNORMAL HIGH (ref 9–35)

## 2018-06-05 NOTE — Progress Notes (Signed)
Pulmonary Critical Care [facility]   PULMONARY CRITICAL CARE SERVICE  PROGRESS NOTE  Date of Service: 06/05/2018  Nathan Short  ZOX:[id number]  DOB: 03-30-1961   DOA: 06/07/2018  Referring Physician: Carron CurieAli Hijazi, MD  HPI: Nathan RathkeJeffrey D Budge is a 57 y.o. male seen for follow up of Acute on Chronic Respiratory Failure.  Patient is currently on full support currently on pressure support mode has been on 28% oxygen good saturations are noted  Medications: Reviewed on Rounds  Physical Exam:  Vitals: Temperature 97.8 pulse 108 respiratory 22 blood pressure 109/47 saturations 97%  Ventilator Settings mode ventilation pressure support FiO2 28% tidal volume 395 PEEP 5 pressure 12  . General: Comfortable at this time . Eyes: Grossly normal lids, irises & conjunctiva . ENT: grossly tongue is normal . Neck: no obvious mass . Cardiovascular: S1 S2 normal no gallop . Respiratory: No rhonchi no rales are noted at this time . Abdomen: soft . Skin: no rash seen on limited exam . Musculoskeletal: not rigid . Psychiatric:unable to assess . Neurologic: no seizure no involuntary movements         Lab Data:   Basic Metabolic Panel: Recent Labs  Lab 05/31/18 0712 06/01/18 0646 06/03/18 0654 06/04/18 0422 06/05/18 0554  NA 143 142 142 141 143  K 4.2 4.0 3.7 4.3 3.4*  CL 105 103 100 100 101  CO2 31 31 31 29  32  GLUCOSE 141* 194* 212* 233* 240*  BUN 23* 24* 21* 18 17  CREATININE 1.13 1.24 1.00 0.99 0.93  CALCIUM 8.8* 8.7* 8.4* 8.5* 8.4*  MG 1.2* 1.7 1.6* 1.7 1.7  PHOS 2.4* 3.7 3.2 3.3 3.4    ABG: No results for input(s): PHART, PCO2ART, PO2ART, HCO3, O2SAT in the last 168 hours.  Liver Function Tests: Recent Labs  Lab 05/31/18 [id number] 06/01/18 0646 06/03/18 0654 06/04/18 0422 06/05/18 0554  AST 33  --   --   --   --   ALT 19  --   --   --   --   ALKPHOS 115  --   --   --   --   BILITOT 1.7*  --   --   --   --   PROT 5.7*  --   --   --   --    ALBUMIN 2.3* 2.4* 2.3* 2.4* 2.1*   No results for input(s): LIPASE, AMYLASE in the last 168 hours. Recent Labs  Lab 05/31/18 0712 06/04/18 0422 06/05/18 0554  AMMONIA 56* 66* 45*    CBC: Recent Labs  Lab 05/31/18 0712 06/01/18 0646 06/03/18 0654 06/05/18 0554  WBC 5.2 3.6* 3.4* 3.3*  NEUTROABS 3.8  --   --   --   HGB 8.8* 8.9* 8.8* 9.7*  HCT 28.4* 29.4* 29.0* 32.2*  MCV 97.6 97.7 97.3 99.1  PLT 93* 102* 113* 130*    Cardiac Enzymes: No results for input(s): CKTOTAL, CKMB, CKMBINDEX, TROPONINI in the last 168 hours.  BNP (last 3 results) No results for input(s): BNP in the last 8760 hours.  ProBNP (last 3 results) No results for input(s): PROBNP in the last 8760 hours.  Radiological Exams: [place] Paracentesis  Result Date: 06/04/2018 Kieth BrightlyWatterson, Shannon A, PA-C     06/04/2018  4:53 PM PROCEDURE SUMMARY: Successful image-guided paracentesis from the right lower abdomen. Yielded 9.6 liters of clear yellow fluid. No immediate complications. EBL: zero Patient tolerated well. Specimen was sent for labs. Please see imaging section of Epic for full  dictation. Villa HerbShannon A Watterson PA-C 06/04/2018 3:21 PM    Assessment/Plan Active Problems:   Acute on chronic respiratory failure with hypoxia (HCC)   Acute metabolic encephalopathy   Cardiac arrest (HCC)   Pleural effusion   Acute renal failure with acute tubular necrosis superimposed on chronic kidney disease, on chronic dialysis (HCC)   Alcohol abuse with alcohol-induced disorder (HCC)   1. Acute on chronic respiratory failure with hypoxia we will continue to wean patient is tolerating pressure support fairly well.  Titrate oxygen continue pulmonary toilet. 2. Acute metabolic encephalopathy grossly unchanged we will continue to monitor 3. Cardiac arrest rhythm is been stable will follow 4. Acute renal failure follow-up labs have been improving 5. Alcohol abuse no acute withdrawal is noted at this time   I have personally  seen and evaluated the patient, evaluated laboratory and imaging results, formulated the assessment and plan and placed orders. The Patient requires high complexity decision making for assessment and support.  Case was discussed on Rounds with the Respiratory Therapy Staff  Yevonne PaxSaadat A , MD [facility] Pulmonary Critical Care Medicine Sleep Medicine

## 2018-06-06 DIAGNOSIS — N170 Acute kidney failure with tubular necrosis: Secondary | ICD-10-CM | POA: Diagnosis not present

## 2018-06-06 DIAGNOSIS — G9341 Metabolic encephalopathy: Secondary | ICD-10-CM | POA: Diagnosis not present

## 2018-06-06 DIAGNOSIS — J9621 Acute and chronic respiratory failure with hypoxia: Secondary | ICD-10-CM | POA: Diagnosis not present

## 2018-06-06 DIAGNOSIS — F1019 Alcohol abuse with unspecified alcohol-induced disorder: Secondary | ICD-10-CM | POA: Diagnosis not present

## 2018-06-06 LAB — BASIC METABOLIC PANEL
Anion gap: 11 (ref 5–15)
BUN: 18 mg/dL (ref 6–20)
CO2: 32 mmol/L (ref 22–32)
Calcium: 8.3 mg/dL — ABNORMAL LOW (ref 8.9–10.3)
Chloride: 100 mmol/L (ref 98–111)
Creatinine, Ser: 1.06 mg/dL (ref 0.61–1.24)
GFR calc Af Amer: >60 mL/min (ref >60)
GFR calc non Af Amer: >60 mL/min (ref >60)
Glucose, Bld: 219 mg/dL — ABNORMAL HIGH (ref 70–99)
Potassium: 3.4 mmol/L — ABNORMAL LOW (ref 3.5–5.1)
Sodium: 143 mmol/L (ref 135–145)

## 2018-06-06 LAB — MAGNESIUM: Magnesium: 1.4 mg/dL — ABNORMAL LOW (ref 1.7–2.4)

## 2018-06-06 NOTE — Progress Notes (Signed)
[facility]   PULMONARY CRITICAL CARE SERVICE  PROGRESS NOTE  Date of Service: 06/06/2018  Nathan RathkeJeffrey D Dicaprio  VWU:[id number]  DOB: 1961-02-22   DOA: 06/05/2018  Referring Physician: Carron CurieAli Hijazi, MD  HPI: Nathan Short is a 57 y.o. male seen for follow up of Acute on Chronic Respiratory Failure.  He is weaning on T collar the goal is for 4 hours looks good so far  Medications: Reviewed on Rounds  Physical Exam:  Vitals: Temperature 97.9 pulse 96 respiratory rate 16 blood pressure 114/63 saturations 97%  Ventilator Settings off the ventilator on T collar  . General: Comfortable at this time . Eyes: Grossly normal lids, irises & conjunctiva . ENT: grossly tongue is normal . Neck: no obvious mass . Cardiovascular: S1 S2 normal no gallop . Respiratory: No rhonchi no rales are noted at this time . Abdomen: soft . Skin: no rash seen on limited exam . Musculoskeletal: not rigid . Psychiatric:unable to assess . Neurologic: no seizure no involuntary movements         Lab Data:   Basic Metabolic Panel: Recent Labs  Lab 05/31/18 0712 06/01/18 0646 06/03/18 0654 06/04/18 0422 06/05/18 0554 06/06/18 0441  NA 143 142 142 141 143 143  K 4.2 4.0 3.7 4.3 3.4* 3.4*  CL 105 103 100 100 101 100  CO2 31 31 31 29  32 32  GLUCOSE 141* 194* 212* 233* 240* 219*  BUN 23* 24* 21* 18 17 18   CREATININE 1.13 1.24 1.00 0.99 0.93 1.06  CALCIUM 8.8* 8.7* 8.4* 8.5* 8.4* 8.3*  MG 1.2* 1.7 1.6* 1.7 1.7 1.4*  PHOS 2.4* 3.7 3.2 3.3 3.4  --     ABG: No results for input(s): PHART, PCO2ART, PO2ART, HCO3, O2SAT in the last 168 hours.  Liver Function Tests: Recent Labs  Lab 05/31/18 [id number] 06/01/18 0646 06/03/18 0654 06/04/18 0422 06/05/18 0554  AST 33  --   --   --   --   ALT 19  --   --   --   --   ALKPHOS 115  --   --   --   --   BILITOT 1.7*  --   --   --   --   PROT 5.7*  --   --   --   --   ALBUMIN 2.3* 2.4* 2.3* 2.4* 2.1*    No results for input(s): LIPASE, AMYLASE in the last 168 hours. Recent Labs  Lab 05/31/18 0712 06/04/18 0422 06/05/18 0554  AMMONIA 56* 66* 45*    CBC: Recent Labs  Lab 05/31/18 0712 06/01/18 0646 06/03/18 0654 06/05/18 0554  WBC 5.2 3.6* 3.4* 3.3*  NEUTROABS 3.8  --   --   --   HGB 8.8* 8.9* 8.8* 9.7*  HCT 28.4* 29.4* 29.0* 32.2*  MCV 97.6 97.7 97.3 99.1  PLT 93* 102* 113* 130*    Cardiac Enzymes: No results for input(s): CKTOTAL, CKMB, CKMBINDEX, TROPONINI in the last 168 hours.  BNP (last 3 results) No results for input(s): BNP in the last 8760 hours.  ProBNP (last 3 results) No results for input(s): PROBNP in the last 8760 hours.  Radiological Exams: [place] Paracentesis  Result Date: 06/04/2018 Kieth BrightlyWatterson, Shannon A, PA-C     06/04/2018  4:53 PM PROCEDURE SUMMARY: Successful image-guided paracentesis from the right lower abdomen. Yielded 9.6 liters of clear yellow fluid. No immediate complications. EBL: zero Patient tolerated well. Specimen was sent for labs. Please see imaging section  of Epic for full dictation. Villa HerbShannon A Watterson PA-C 06/04/2018 3:21 PM    Assessment/Plan Active Problems:   Acute on chronic respiratory failure with hypoxia (HCC)   Acute metabolic encephalopathy   Cardiac arrest (HCC)   Pleural effusion   Acute renal failure with acute tubular necrosis superimposed on chronic kidney disease, on chronic dialysis (HCC)   Alcohol abuse with alcohol-induced disorder (HCC)   1. Acute on chronic respiratory failure with hypoxia we will continue with T collar weaning as tolerated continue pulmonary toilet supportive care 2. Acute metabolic encephalopathy unchanged 3. Cardiac arrest rhythm is stable 4. Acute renal failure resolved we will continue to monitor 5. Alcohol abuse at baseline we will continue with present management no active withdrawal is noted 6. Pleural effusion follow-up x-rays   I have personally seen and evaluated the patient,  evaluated laboratory and imaging results, formulated the assessment and plan and placed orders. The Patient requires high complexity decision making for assessment and support.  Case was discussed on Rounds with the Respiratory Therapy Staff  Yevonne PaxSaadat A Ripken Rekowski, MD [facility] Pulmonary Critical Care Medicine Sleep Medicine

## 2018-06-07 ENCOUNTER — Encounter: Payer: Self-pay | Admitting: Internal Medicine

## 2018-06-07 DIAGNOSIS — G9341 Metabolic encephalopathy: Secondary | ICD-10-CM | POA: Diagnosis not present

## 2018-06-07 DIAGNOSIS — J9621 Acute and chronic respiratory failure with hypoxia: Secondary | ICD-10-CM | POA: Diagnosis not present

## 2018-06-07 DIAGNOSIS — N170 Acute kidney failure with tubular necrosis: Secondary | ICD-10-CM | POA: Diagnosis not present

## 2018-06-07 DIAGNOSIS — F1019 Alcohol abuse with unspecified alcohol-induced disorder: Secondary | ICD-10-CM | POA: Diagnosis not present

## 2018-06-07 LAB — RENAL FUNCTION PANEL
Albumin: 1.9 g/dL — ABNORMAL LOW (ref 3.5–5.0)
Anion gap: 10 (ref 5–15)
BUN: 18 mg/dL (ref 6–20)
CO2: 34 mmol/L — ABNORMAL HIGH (ref 22–32)
Calcium: 8.4 mg/dL — ABNORMAL LOW (ref 8.9–10.3)
Chloride: 99 mmol/L (ref 98–111)
Creatinine, Ser: 1.13 mg/dL (ref 0.61–1.24)
GFR calc Af Amer: >60 mL/min (ref >60)
GFR calc non Af Amer: >60 mL/min (ref >60)
Glucose, Bld: 210 mg/dL — ABNORMAL HIGH (ref 70–99)
Phosphorus: 3.3 mg/dL (ref 2.5–4.6)
Potassium: 3.6 mmol/L (ref 3.5–5.1)
Sodium: 143 mmol/L (ref 135–145)

## 2018-06-07 LAB — CBC
HCT: 30.3 % — ABNORMAL LOW (ref 39.0–52.0)
Hemoglobin: 9 g/dL — ABNORMAL LOW (ref 13.0–17.0)
MCH: 29.4 pg (ref 26.0–34.0)
MCHC: 29.7 g/dL — AB (ref 30.0–36.0)
MCV: 99 fL (ref 80.0–100.0)
Platelets: 127 10*3/uL — ABNORMAL LOW (ref 150–400)
RBC: 3.06 MIL/uL — ABNORMAL LOW (ref 4.22–5.81)
RDW: 16.6 % — ABNORMAL HIGH (ref 11.5–15.5)
WBC: 3.5 10*3/uL — ABNORMAL LOW (ref 4.0–10.5)
nRBC: 0 % (ref 0.0–0.2)

## 2018-06-07 LAB — MAGNESIUM: Magnesium: 1.8 mg/dL (ref 1.7–2.4)

## 2018-06-07 NOTE — Progress Notes (Signed)
[facility] Critical Care [facility] [place]   PULMONARY CRITICAL CARE SERVICE  PROGRESS NOTE  Date of Service: 06/07/2018  Nathan Short  ZOX:[id number]  DOB: Dec 19, 1960   DOA: 11/03/17  Referring Physician: Carron CurieAli Hijazi, MD  HPI: Nathan Short is a 57 y.o. male seen for follow up of Acute on Chronic Respiratory Failure.  Patient right now is on T collar on 35% FiO2 the goal for today is 8 hours  Medications: Reviewed on Rounds  Physical Exam:  Vitals: Temperature 97.8 pulse 93 respiratory 26 blood pressure 108/68 saturations 96%  Ventilator Settings off the ventilator on T collar  . General: Comfortable at this time . Eyes: Grossly normal lids, irises & conjunctiva . ENT: grossly tongue is normal . Neck: no obvious mass . Cardiovascular: S1 S2 normal no gallop . Respiratory: No rhonchi or rales are noted at this time . Abdomen: soft . Skin: no rash seen on limited exam . Musculoskeletal: not rigid . Psychiatric:unable to assess . Neurologic: no seizure no involuntary movements         Lab Data:   Basic Metabolic Panel: Recent Labs  Lab 06/01/18 0646 06/03/18 0654 06/04/18 0422 06/05/18 0554 06/06/18 0441 06/07/18 0547  NA 142 142 141 143 143 143  K 4.0 3.7 4.3 3.4* 3.4* 3.6  CL 103 100 100 101 100 99  CO2 31 31 29  32 32 34*  GLUCOSE 194* 212* 233* 240* 219* 210*  BUN 24* 21* 18 17 18 18   CREATININE 1.24 1.00 0.99 0.93 1.06 1.13  CALCIUM 8.7* 8.4* 8.5* 8.4* 8.3* 8.4*  MG 1.7 1.6* 1.7 1.7 1.4* 1.8  PHOS 3.7 3.2 3.3 3.4  --  3.3    ABG: No results for input(s): PHART, PCO2ART, PO2ART, HCO3, O2SAT in the last 168 hours.  Liver Function Tests: Recent Labs  Lab 06/01/18 0646 06/03/18 0654 06/04/18 0422 06/05/18 0554 06/07/18 0547  ALBUMIN 2.4* 2.3* 2.4* 2.1* 1.9*   No results for input(s): LIPASE, AMYLASE in the last 168 hours. Recent Labs  Lab 06/04/18 0422 06/05/18 0554  AMMONIA 66* 45*    CBC: Recent Labs   Lab 06/01/18 0646 06/03/18 0654 06/05/18 0554 06/07/18 0547  WBC 3.6* 3.4* 3.3* 3.5*  HGB 8.9* 8.8* 9.7* 9.0*  HCT 29.4* 29.0* 32.2* 30.3*  MCV 97.7 97.3 99.1 99.0  PLT 102* 113* 130* 127*    Cardiac Enzymes: No results for input(s): CKTOTAL, CKMB, CKMBINDEX, TROPONINI in the last 168 hours.  BNP (last 3 results) No results for input(s): BNP in the last 8760 hours.  ProBNP (last 3 results) No results for input(s): PROBNP in the last 8760 hours.  Radiological Exams: No results found.  Assessment/Plan Active Problems:   Acute on chronic respiratory failure with hypoxia (HCC)   Acute metabolic encephalopathy   Cardiac arrest (HCC)   Pleural effusion   Alcohol abuse with alcohol-induced disorder (HCC)   1. Acute on chronic respiratory failure with hypoxia we will continue with T collar weans as tolerated goal is for 8 hours continue pulmonary toilet supportive care 2. Metabolic encephalopathy grossly unchanged continue present management 3. Cardiac arrest rhythm stable 4. Pleural effusion follow radiologically 5. Acute renal failure improved 6. Alcohol abuse patient's at baseline   I have personally seen and evaluated the patient, evaluated laboratory and imaging results, formulated the assessment and plan and placed orders. The Patient requires high complexity decision making for assessment and support.  Case was discussed on Rounds with the Respiratory Therapy Staff  Allyne Gee, MD [facility] Pulmonary Critical Care Medicine Sleep Medicine

## 2018-06-08 DIAGNOSIS — F1019 Alcohol abuse with unspecified alcohol-induced disorder: Secondary | ICD-10-CM | POA: Diagnosis not present

## 2018-06-08 DIAGNOSIS — G9341 Metabolic encephalopathy: Secondary | ICD-10-CM | POA: Diagnosis not present

## 2018-06-08 DIAGNOSIS — J9621 Acute and chronic respiratory failure with hypoxia: Secondary | ICD-10-CM | POA: Diagnosis not present

## 2018-06-08 DIAGNOSIS — N170 Acute kidney failure with tubular necrosis: Secondary | ICD-10-CM | POA: Diagnosis not present

## 2018-06-08 NOTE — Progress Notes (Signed)
[facility]   PULMONARY CRITICAL CARE SERVICE  PROGRESS NOTE  Date of Service: 06/08/2018  Nathan RathkeJeffrey D Bridgewater  [id number]  DOB: 01-14-1961   DOA: 05/25/2018  Referring Physician: Carron CurieAli Hijazi, MD  HPI: Nathan Short is a 57 y.o. male seen for follow up of Acute on Chronic Respiratory Failure.  Patient is on T collar right now goal is for 12 hours  Medications: Reviewed on Rounds  Physical Exam:  Vitals: Temperature 98.0 pulse 94 respiratory rate 16 blood pressure 99/58 saturations 94%  Ventilator Settings off ventilator on T collar FiO2 28%  . General: Comfortable at this time . Eyes: Grossly normal lids, irises & conjunctiva . ENT: grossly tongue is normal . Neck: no obvious mass . Cardiovascular: S1 S2 normal no gallop . Respiratory: No rhonchi or rales are noted . Abdomen: soft . Skin: no rash seen on limited exam . Musculoskeletal: not rigid . Psychiatric:unable to assess . Neurologic: no seizure no involuntary movements         Lab Data:   Basic Metabolic Panel: Recent Labs  Lab 06/03/18 0654 06/04/18 0422 06/05/18 0554 06/06/18 0441 06/07/18 0547  NA 142 141 143 143 143  K 3.7 4.3 3.4* 3.4* 3.6  CL 100 100 101 100 99  CO2 31 29 32 32 34*  GLUCOSE 212* 233* 240* 219* 210*  BUN 21* 18 17 18 18   CREATININE 1.00 0.99 0.93 1.06 1.13  CALCIUM 8.4* 8.5* 8.4* 8.3* 8.4*  MG 1.6* 1.7 1.7 1.4* 1.8  PHOS 3.2 3.3 3.4  --  3.3    ABG: No results for input(s): PHART, PCO2ART, PO2ART, HCO3, O2SAT in the last 168 hours.  Liver Function Tests: Recent Labs  Lab 06/03/18 0654 06/04/18 0422 06/05/18 0554 06/07/18 0547  ALBUMIN 2.3* 2.4* 2.1* 1.9*   No results for input(s): LIPASE, AMYLASE in the last 168 hours. Recent Labs  Lab 06/04/18 0422 06/05/18 0554  AMMONIA 66* 45*    CBC: Recent Labs  Lab 06/03/18 0654 06/05/18 0554 06/07/18 0547  WBC 3.4* 3.3* 3.5*  HGB 8.8* 9.7* 9.0*  HCT 29.0*  32.2* 30.3*  MCV 97.3 99.1 99.0  PLT 113* 130* 127*    Cardiac Enzymes: No results for input(s): CKTOTAL, CKMB, CKMBINDEX, TROPONINI in the last 168 hours.  BNP (last 3 results) No results for input(s): BNP in the last 8760 hours.  ProBNP (last 3 results) No results for input(s): PROBNP in the last 8760 hours.  Radiological Exams: No results found.  Assessment/Plan Active Problems:   Acute on chronic respiratory failure with hypoxia (HCC)   Acute metabolic encephalopathy   Cardiac arrest (HCC)   Pleural effusion   Alcohol abuse with alcohol-induced disorder (HCC)   1. Acute on chronic respiratory failure with hypoxia we will continue with T collar trials continue pulmonary toilet secretion management 2. Metabolic encephalopathy grossly unchanged 3. Cardiac arrest rhythm stable 4. Pleural effusions at baseline we will continue with supportive care 5. Alcohol abuse no active withdrawal   I have personally seen and evaluated the patient, evaluated laboratory and imaging results, formulated the assessment and plan and placed orders. The Patient requires high complexity decision making for assessment and support.  Case was discussed on Rounds with the Respiratory Therapy Staff  Yevonne PaxSaadat A Lekeshia Kram, MD [facility]FCCP Pulmonary Critical Care Medicine Sleep Medicine

## 2018-06-09 DIAGNOSIS — J9621 Acute and chronic respiratory failure with hypoxia: Secondary | ICD-10-CM | POA: Diagnosis not present

## 2018-06-09 DIAGNOSIS — F1019 Alcohol abuse with unspecified alcohol-induced disorder: Secondary | ICD-10-CM | POA: Diagnosis not present

## 2018-06-09 DIAGNOSIS — N170 Acute kidney failure with tubular necrosis: Secondary | ICD-10-CM | POA: Diagnosis not present

## 2018-06-09 DIAGNOSIS — G9341 Metabolic encephalopathy: Secondary | ICD-10-CM | POA: Diagnosis not present

## 2018-06-09 LAB — CBC
HCT: 34.3 % — ABNORMAL LOW (ref 39.0–52.0)
Hemoglobin: 10.3 g/dL — ABNORMAL LOW (ref 13.0–17.0)
MCH: 29.8 pg (ref 26.0–34.0)
MCHC: 30 g/dL (ref 30.0–36.0)
MCV: 99.1 fL (ref 80.0–100.0)
Platelets: 133 10*3/uL — ABNORMAL LOW (ref 150–400)
RBC: 3.46 MIL/uL — ABNORMAL LOW (ref 4.22–5.81)
RDW: 16.5 % — ABNORMAL HIGH (ref 11.5–15.5)
WBC: 3 10*3/uL — AB (ref 4.0–10.5)
nRBC: 0 % (ref 0.0–0.2)

## 2018-06-09 LAB — CULTURE, BODY FLUID W GRAM STAIN -BOTTLE: Culture: NO GROWTH

## 2018-06-09 LAB — BASIC METABOLIC PANEL
Anion gap: 9 (ref 5–15)
BUN: 20 mg/dL (ref 6–20)
CO2: 32 mmol/L (ref 22–32)
CREATININE: 1.06 mg/dL (ref 0.61–1.24)
Calcium: 8.6 mg/dL — ABNORMAL LOW (ref 8.9–10.3)
Chloride: 101 mmol/L (ref 98–111)
GFR calc Af Amer: >60 mL/min (ref >60)
GFR calc non Af Amer: >60 mL/min (ref >60)
Glucose, Bld: 190 mg/dL — ABNORMAL HIGH (ref 70–99)
Potassium: 4.3 mmol/L (ref 3.5–5.1)
SODIUM: 142 mmol/L (ref 135–145)

## 2018-06-09 LAB — MAGNESIUM: Magnesium: 1.4 mg/dL — ABNORMAL LOW (ref 1.7–2.4)

## 2018-06-09 LAB — CULTURE, BODY FLUID-BOTTLE

## 2018-06-09 NOTE — Progress Notes (Signed)
[facility]   PULMONARY CRITICAL CARE SERVICE  PROGRESS NOTE  Date of Service: 06/09/2018  Nathan RathkeJeffrey D Rosato  ZOX:[id number]  DOB: 09-Jun-1961   DOA: 05/22/2018  Referring Physician: Carron CurieAli Hijazi, MD  HPI: Nathan Short is a 57 y.o. male seen for follow up of Acute on Chronic Respiratory Failure.  Patient is on T collar at this time on 28% FiO2 the goal is 16 hours  Medications: Reviewed on Rounds  Physical Exam:  Vitals: Temperature 98.1 pulse 97 respiratory 18 blood pressure 96/60 saturations 95%  Ventilator Settings currently on T collar FiO2 28%  . General: Comfortable at this time . Eyes: Grossly normal lids, irises & conjunctiva . ENT: grossly tongue is normal . Neck: no obvious mass . Cardiovascular: S1 S2 normal no gallop . Respiratory: Rhonchi or rales are noted . Abdomen: soft . Skin: no rash seen on limited exam . Musculoskeletal: not rigid . Psychiatric:unable to assess . Neurologic: no seizure no involuntary movements         Lab Data:   Basic Metabolic Panel: Recent Labs  Lab 06/03/18 0654 06/04/18 0422 06/05/18 0554 06/06/18 0441 06/07/18 0547 06/09/18 0547  NA 142 141 143 143 143 142  K 3.7 4.3 3.4* 3.4* 3.6 4.3  CL 100 100 101 100 99 101  CO2 31 29 32 32 34* 32  GLUCOSE 212* 233* 240* 219* 210* 190*  BUN 21* 18 17 18 18 20   CREATININE 1.00 0.99 0.93 1.06 1.13 1.06  CALCIUM 8.4* 8.5* 8.4* 8.3* 8.4* 8.6*  MG 1.6* 1.7 1.7 1.4* 1.8 1.4*  PHOS 3.2 3.3 3.4  --  3.3  --     ABG: No results for input(s): PHART, PCO2ART, PO2ART, HCO3, O2SAT in the last 168 hours.  Liver Function Tests: Recent Labs  Lab 06/03/18 0654 06/04/18 0422 06/05/18 0554 06/07/18 0547  ALBUMIN 2.3* 2.4* 2.1* 1.9*   No results for input(s): LIPASE, AMYLASE in the last 168 hours. Recent Labs  Lab 06/04/18 0422 06/05/18 0554  AMMONIA 66* 45*    CBC: Recent Labs  Lab 06/03/18 0654 06/05/18 0554  06/07/18 0547 06/09/18 0547  WBC 3.4* 3.3* 3.5* 3.0*  HGB 8.8* 9.7* 9.0* 10.3*  HCT 29.0* 32.2* 30.3* 34.3*  MCV 97.3 99.1 99.0 99.1  PLT 113* 130* 127* 133*    Cardiac Enzymes: No results for input(s): CKTOTAL, CKMB, CKMBINDEX, TROPONINI in the last 168 hours.  BNP (last 3 results) No results for input(s): BNP in the last 8760 hours.  ProBNP (last 3 results) No results for input(s): PROBNP in the last 8760 hours.  Radiological Exams: No results found.  Assessment/Plan Active Problems:   Acute on chronic respiratory failure with hypoxia (HCC)   Acute metabolic encephalopathy   Cardiac arrest (HCC)   Pleural effusion   Alcohol abuse with alcohol-induced disorder (HCC)   1. Acute on chronic respiratory failure with hypoxia we will continue with T collar continue pulmonary toilet supportive care.  The goal is 16 hours 2. Acute metabolic encephalopathy stable we will monitor 3. Cardiac arrest rhythm stable 4. Pleural effusion follow x-rays 5. Alcohol abuse no withdrawal noted   I have personally seen and evaluated the patient, evaluated laboratory and imaging results, formulated the assessment and plan and placed orders. The Patient requires high complexity decision making for assessment and support.  Case was discussed on Rounds with the Respiratory Therapy Staff  Yevonne PaxSaadat A Darsha Zumstein, MD [facility] Pulmonary Critical Care Medicine Sleep Medicine

## 2018-06-10 DIAGNOSIS — G9341 Metabolic encephalopathy: Secondary | ICD-10-CM | POA: Diagnosis not present

## 2018-06-10 DIAGNOSIS — J9621 Acute and chronic respiratory failure with hypoxia: Secondary | ICD-10-CM | POA: Diagnosis not present

## 2018-06-10 DIAGNOSIS — F1019 Alcohol abuse with unspecified alcohol-induced disorder: Secondary | ICD-10-CM | POA: Diagnosis not present

## 2018-06-10 DIAGNOSIS — N170 Acute kidney failure with tubular necrosis: Secondary | ICD-10-CM | POA: Diagnosis not present

## 2018-06-10 LAB — MAGNESIUM: Magnesium: 1.7 mg/dL (ref 1.7–2.4)

## 2018-06-10 LAB — AMMONIA: Ammonia: 32 umol/L (ref 9–35)

## 2018-06-10 NOTE — Progress Notes (Addendum)
Pulmonary Critical Care Medicine [facility] [place]   PULMONARY CRITICAL CARE SERVICE  PROGRESS NOTE  Date of Service: 06/10/2018  Nathan Short  [id number]  DOB: 05-16-1961   DOA: 05/21/2018  Referring Physician: Carron Curie, MD  HPI: Nathan Short is a 58 y.o. male seen for follow up of Acute on Chronic Respiratory Failure.  Patient is currently doing well on trach collar at 28% FiO2.  He was able to do 16 hours on trach collar yesterday and goal today is 20 hours.  Patient is currently doing well with minimal secretions.  Medications: Reviewed on Rounds  Physical Exam:  Vitals: Pulse 95 respirations 17 blood pressure 108/66 O2 sat 93% temp 98.6  Ventilator Settings patient's not currently on ventilator  . General: Comfortable at this time . Eyes: Grossly normal lids, irises & conjunctiva . ENT: grossly tongue is normal . Neck: no obvious mass . Cardiovascular: S1 S2 normal no gallop . Respiratory: No wheezes or rhonchi noted . Abdomen: soft . Skin: no rash seen on limited exam . Musculoskeletal: not rigid . Psychiatric:unable to assess . Neurologic: no seizure no involuntary movements         Lab Data:   Basic Metabolic Panel: Recent Labs  Lab 06/04/18 0422 06/05/18 0554 06/06/18 0441 06/07/18 0547 06/09/18 0547 06/10/18 0731  NA 141 143 143 143 142  --   K 4.3 3.4* 3.4* 3.6 4.3  --   CL 100 101 100 99 101  --   CO2 29 32 32 34* 32  --   GLUCOSE 233* 240* 219* 210* 190*  --   BUN 18 17 18 18 20   --   CREATININE 0.99 0.93 1.06 1.13 1.06  --   CALCIUM 8.5* 8.4* 8.3* 8.4* 8.6*  --   MG 1.7 1.7 1.4* 1.8 1.4* 1.7  PHOS 3.3 3.4  --  3.3  --   --     ABG: No results for input(s): PHART, PCO2ART, PO2ART, HCO3, O2SAT in the last 168 hours.  Liver Function Tests: Recent Labs  Lab 06/04/18 0422 06/05/18 0554 06/07/18 0547  ALBUMIN 2.4* 2.1* 1.9*   No results for input(s): LIPASE, AMYLASE in the last 168 hours. Recent Labs  Lab  06/04/18 0422 06/05/18 0554 06/10/18 0731  AMMONIA 66* 45* 32    CBC: Recent Labs  Lab 06/05/18 0554 06/07/18 0547 06/09/18 0547  WBC 3.3* 3.5* 3.0*  HGB 9.7* 9.0* 10.3*  HCT 32.2* 30.3* 34.3*  MCV 99.1 99.0 99.1  PLT 130* 127* 133*    Cardiac Enzymes: No results for input(s): CKTOTAL, CKMB, CKMBINDEX, TROPONINI in the last 168 hours.  BNP (last 3 results) No results for input(s): BNP in the last 8760 hours.  ProBNP (last 3 results) No results for input(s): PROBNP in the last 8760 hours.  Radiological Exams: No results found.  Assessment/Plan Active Problems:   Acute on chronic respiratory failure with hypoxia (HCC)   Acute metabolic encephalopathy   Cardiac arrest (HCC)   Pleural effusion   Alcohol abuse with alcohol-induced disorder (HCC)   1. Acute on chronic respiratory failure with hypoxia continue trach collar wean, pulmonary toilet and supportive care.  Goal today is 20 hours. 2. Acute metabolic encephalopathy stable continue to monitor 3. Cardiac arrest rhythm stable 4. Pleural effusion follow x-rays 5. Alcohol use no withdrawal noted   I have personally seen and evaluated the patient, evaluated laboratory and imaging results, formulated the assessment and plan and placed orders. The Patient requires high  complexity decision making for assessment and support.  Case was discussed on Rounds with the Respiratory Therapy Staff  Allyne Gee, MD [facility] Pulmonary Critical Care Medicine Sleep Medicine

## 2018-06-10 DEATH — deceased

## 2018-06-11 ENCOUNTER — Other Ambulatory Visit (HOSPITAL_COMMUNITY): Payer: Self-pay

## 2018-06-11 DIAGNOSIS — N170 Acute kidney failure with tubular necrosis: Secondary | ICD-10-CM | POA: Diagnosis not present

## 2018-06-11 DIAGNOSIS — F1019 Alcohol abuse with unspecified alcohol-induced disorder: Secondary | ICD-10-CM | POA: Diagnosis not present

## 2018-06-11 DIAGNOSIS — J9621 Acute and chronic respiratory failure with hypoxia: Secondary | ICD-10-CM | POA: Diagnosis not present

## 2018-06-11 DIAGNOSIS — G9341 Metabolic encephalopathy: Secondary | ICD-10-CM | POA: Diagnosis not present

## 2018-06-11 NOTE — Progress Notes (Signed)
Pulmonary Critical Care Medicine [facility] [place]   PULMONARY CRITICAL CARE SERVICE  PROGRESS NOTE  Date of Service: 06/11/2018  Nathan Short  [id number]  DOB: 1960-12-18   DOA: 06/28/18  Referring Physician: Carron Curie, MD  HPI: Nathan Short is a 58 y.o. male seen for follow up of Acute on Chronic Respiratory Failure.  Patient is on T collar right now with a goal of 24 hours  Medications: Reviewed on Rounds  Physical Exam:  Vitals: Temperature 98.6 pulse 90 respiratory 17 blood pressure 106/60 saturations 98%  Ventilator Settings off the ventilator on T collar now  . General: Comfortable at this time . Eyes: Grossly normal lids, irises & conjunctiva . ENT: grossly tongue is normal . Neck: no obvious mass . Cardiovascular: S1 S2 normal no gallop . Respiratory: No rhonchi or rales are noted . Abdomen: soft . Skin: no rash seen on limited exam . Musculoskeletal: not rigid . Psychiatric:unable to assess . Neurologic: no seizure no involuntary movements         Lab Data:   Basic Metabolic Panel: Recent Labs  Lab 06/05/18 0554 06/06/18 0441 06/07/18 0547 06/09/18 0547 06/10/18 0731  NA 143 143 143 142  --   K 3.4* 3.4* 3.6 4.3  --   CL 101 100 99 101  --   CO2 32 32 34* 32  --   GLUCOSE 240* 219* 210* 190*  --   BUN 17 18 18 20   --   CREATININE 0.93 1.06 1.13 1.06  --   CALCIUM 8.4* 8.3* 8.4* 8.6*  --   MG 1.7 1.4* 1.8 1.4* 1.7  PHOS 3.4  --  3.3  --   --     ABG: No results for input(s): PHART, PCO2ART, PO2ART, HCO3, O2SAT in the last 168 hours.  Liver Function Tests: Recent Labs  Lab 06/05/18 0554 06/07/18 0547  ALBUMIN 2.1* 1.9*   No results for input(s): LIPASE, AMYLASE in the last 168 hours. Recent Labs  Lab 06/05/18 0554 06/10/18 0731  AMMONIA 45* 32    CBC: Recent Labs  Lab 06/05/18 0554 06/07/18 0547 06/09/18 0547  WBC 3.3* 3.5* 3.0*  HGB 9.7* 9.0* 10.3*  HCT 32.2* 30.3* 34.3*  MCV 99.1 99.0 99.1   PLT 130* 127* 133*    Cardiac Enzymes: No results for input(s): CKTOTAL, CKMB, CKMBINDEX, TROPONINI in the last 168 hours.  BNP (last 3 results) No results for input(s): BNP in the last 8760 hours.  ProBNP (last 3 results) No results for input(s): PROBNP in the last 8760 hours.  Radiological Exams: Dg Chest Port 1 View  Result Date: 06/11/2018 CLINICAL DATA:  Left pleural extract the history of pleural effusion. EXAM: PORTABLE CHEST 1 VIEW COMPARISON:  CT chest 06/02/2018. Single-view of the chest 28-Jun-2018. FINDINGS: Large left pleural effusion and basilar airspace disease are again seen. The right lung is clear. Cardiac silhouette is largely obscured. No pneumothorax. Tracheostomy tube and NG tube remain in place. IMPRESSION: No marked change in a large left pleural effusion and airspace disease. No new abnormality. Electronically Signed   By: Drusilla Kanner M.D.   On: 06/11/2018 08:03    Assessment/Plan Active Problems:   Acute on chronic respiratory failure with hypoxia (HCC)   Acute metabolic encephalopathy   Cardiac arrest (HCC)   Pleural effusion   Alcohol abuse with alcohol-induced disorder (HCC)   1. Acute on chronic respiratory failure with hypoxia we will continue with T collar continue pulmonary toilet supportive care  goal is 24 hours 2. Acute metabolic encephalopathy we will continue with supportive care 3. Cardiac arrest rhythm is stable at this time 4. Alcohol abuse no active withdrawal 5. Pleural effusions follow-up on chest x-ray large left pleural effusion still present   I have personally seen and evaluated the patient, evaluated laboratory and imaging results, formulated the assessment and plan and placed orders. The Patient requires high complexity decision making for assessment and support.  Case was discussed on Rounds with the Respiratory Therapy Staff  Yevonne Pax, MD [facility] Pulmonary Critical Care Medicine Sleep Medicine

## 2018-06-12 ENCOUNTER — Other Ambulatory Visit (HOSPITAL_COMMUNITY): Payer: Self-pay

## 2018-06-12 DIAGNOSIS — J9621 Acute and chronic respiratory failure with hypoxia: Secondary | ICD-10-CM | POA: Diagnosis not present

## 2018-06-12 DIAGNOSIS — F1019 Alcohol abuse with unspecified alcohol-induced disorder: Secondary | ICD-10-CM | POA: Diagnosis not present

## 2018-06-12 DIAGNOSIS — G9341 Metabolic encephalopathy: Secondary | ICD-10-CM | POA: Diagnosis not present

## 2018-06-12 DIAGNOSIS — N170 Acute kidney failure with tubular necrosis: Secondary | ICD-10-CM | POA: Diagnosis not present

## 2018-06-12 LAB — GRAM STAIN

## 2018-06-12 LAB — BASIC METABOLIC PANEL
Anion gap: 9 (ref 5–15)
BUN: 29 mg/dL — ABNORMAL HIGH (ref 6–20)
CO2: 35 mmol/L — ABNORMAL HIGH (ref 22–32)
Calcium: 8.4 mg/dL — ABNORMAL LOW (ref 8.9–10.3)
Chloride: 97 mmol/L — ABNORMAL LOW (ref 98–111)
Creatinine, Ser: 1.15 mg/dL (ref 0.61–1.24)
GFR calc Af Amer: >60 mL/min (ref >60)
Glucose, Bld: 179 mg/dL — ABNORMAL HIGH (ref 70–99)
Potassium: 4.1 mmol/L (ref 3.5–5.1)
Sodium: 141 mmol/L (ref 135–145)

## 2018-06-12 LAB — CBC
HCT: 31.2 % — ABNORMAL LOW (ref 39.0–52.0)
Hemoglobin: 9.2 g/dL — ABNORMAL LOW (ref 13.0–17.0)
MCH: 29.1 pg (ref 26.0–34.0)
MCHC: 29.5 g/dL — ABNORMAL LOW (ref 30.0–36.0)
MCV: 98.7 fL (ref 80.0–100.0)
PLATELETS: 123 10*3/uL — AB (ref 150–400)
RBC: 3.16 MIL/uL — ABNORMAL LOW (ref 4.22–5.81)
RDW: 15.9 % — ABNORMAL HIGH (ref 11.5–15.5)
WBC: 3.4 10*3/uL — ABNORMAL LOW (ref 4.0–10.5)
nRBC: 0 % (ref 0.0–0.2)

## 2018-06-12 LAB — PROTEIN, PLEURAL OR PERITONEAL FLUID

## 2018-06-12 LAB — LACTATE DEHYDROGENASE, PLEURAL OR PERITONEAL FLUID: LD, Fluid: 75 U/L — ABNORMAL HIGH (ref 3–23)

## 2018-06-12 LAB — MAGNESIUM: Magnesium: 1.7 mg/dL (ref 1.7–2.4)

## 2018-06-12 LAB — GLUCOSE, PLEURAL OR PERITONEAL FLUID: Glucose, Fluid: 177 mg/dL

## 2018-06-12 MED ORDER — LIDOCAINE HCL (PF) 1 % IJ SOLN
INTRAMUSCULAR | Status: AC
  Filled 2018-06-12: qty 30

## 2018-06-12 NOTE — Procedures (Signed)
PROCEDURE SUMMARY:  Successful US guided left thoracentesis. Yielded 1.5 L of slightly hazy yellow fluid. Pt tolerated procedure well. No immediate complications.  Specimen was sent for labs. CXR ordered.  EBL < 5 mL  Brayton El PA-C 06/12/2018 9:59 AM

## 2018-06-12 NOTE — Progress Notes (Signed)
Pulmonary Critical Care Medicine [facility] [place]   PULMONARY CRITICAL CARE SERVICE  PROGRESS NOTE  Date of Service: 06/12/2018  Nathan Short  OIZ:[id number]  DOB: 1961-01-28   DOA: 06/05/2018  Referring Physician: Carron Curie, MD  HPI: Nathan Short is a 58 y.o. male seen for follow up of Acute on Chronic Respiratory Failure.  Comfortable right now without distress.  Patient is on T-bar has been on T-bar for 24 hours.  Medications: Reviewed on Rounds  Physical Exam:  Vitals: Temperature 97.6 pulse 88 respiratory 24 blood pressure 110/45 saturations 92%  Ventilator Settings off the ventilator on T-bar  . General: Comfortable at this time . Eyes: Grossly normal lids, irises & conjunctiva . ENT: grossly tongue is normal . Neck: no obvious mass . Cardiovascular: S1 S2 normal no gallop . Respiratory: No rhonchi or rales are noted . Abdomen: soft . Skin: no rash seen on limited exam . Musculoskeletal: not rigid . Psychiatric:unable to assess . Neurologic: no seizure no involuntary movements         Lab Data:   Basic Metabolic Panel: Recent Labs  Lab 06/06/18 0441 06/07/18 0547 06/09/18 0547 06/10/18 0731 06/12/18 0655  NA 143 143 142  --  141  K 3.4* 3.6 4.3  --  4.1  CL 100 99 101  --  97*  CO2 32 34* 32  --  35*  GLUCOSE 219* 210* 190*  --  179*  BUN 18 18 20   --  29*  CREATININE 1.06 1.13 1.06  --  1.15  CALCIUM 8.3* 8.4* 8.6*  --  8.4*  MG 1.4* 1.8 1.4* 1.7 1.7  PHOS  --  3.3  --   --   --     ABG: No results for input(s): PHART, PCO2ART, PO2ART, HCO3, O2SAT in the last 168 hours.  Liver Function Tests: Recent Labs  Lab 06/07/18 0547  ALBUMIN 1.9*   No results for input(s): LIPASE, AMYLASE in the last 168 hours. Recent Labs  Lab 06/10/18 0731  AMMONIA 32    CBC: Recent Labs  Lab 06/07/18 0547 06/09/18 0547 06/12/18 0631  WBC 3.5* 3.0* 3.4*  HGB 9.0* 10.3* 9.2*  HCT 30.3* 34.3* 31.2*  MCV 99.0 99.1 98.7  PLT 127*  133* 123*    Cardiac Enzymes: No results for input(s): CKTOTAL, CKMB, CKMBINDEX, TROPONINI in the last 168 hours.  BNP (last 3 results) No results for input(s): BNP in the last 8760 hours.  ProBNP (last 3 results) No results for input(s): PROBNP in the last 8760 hours.  Radiological Exams: US Abdomen Limited  Result Date: 06/12/2018 CLINICAL DATA:  Alcohol abuse. EXAM: LIMITED ABDOMEN ULTRASOUND FOR ASCITES TECHNIQUE: Limited ultrasound survey for ascites was performed in all four abdominal quadrants. COMPARISON:  No prior. FINDINGS: Prominent amount of ascites noted. IMPRESSION: Prominent ascites. Electronically Signed   By: Maisie Fus  Register   On: 06/12/2018 10:38   Dg Chest Port 1 View  Result Date: 06/12/2018 CLINICAL DATA:  Status post left thoracentesis EXAM: PORTABLE CHEST 1 VIEW COMPARISON:  2018-07-09 FINDINGS: Near complete resolution of the left pleural effusion following thoracentesis. There is improvement in the left lung aeration with some residual left lower lobe retrocardiac collapse/consolidation. Negative for pneumothorax. Low lung volumes persist. Heart remains enlarged with vascular congestion. Degenerative changes noted spine. IMPRESSION: Near complete resolution of the left effusion following thoracentesis. No pneumothorax Residual left lower lobe retrocardiac atelectasis/consolidation Electronically Signed   By: Judie Petit.  Shick M.D.   On: 06/12/2018  10:30   Dg Chest Port 1 View  Result Date: 06/11/2018 CLINICAL DATA:  Left pleural extract the history of pleural effusion. EXAM: PORTABLE CHEST 1 VIEW COMPARISON:  CT chest 06/02/2018. Single-view of the chest 06/14/18. FINDINGS: Large left pleural effusion and basilar airspace disease are again seen. The right lung is clear. Cardiac silhouette is largely obscured. No pneumothorax. Tracheostomy tube and NG tube remain in place. IMPRESSION: No marked change in a large left pleural effusion and airspace disease. No new abnormality.  Electronically Signed   By: Drusilla Kanner M.D.   On: 06/11/2018 08:03   US Thoracentesis Asp Pleural Space W/img Guide  Result Date: 06/12/2018 INDICATION: Shortness of breath. Left-sided pleural effusion. Request for diagnostic and therapeutic thoracentesis. EXAM: ULTRASOUND GUIDED LEFT THORACENTESIS MEDICATIONS: None. COMPLICATIONS: None immediate. PROCEDURE: An ultrasound guided thoracentesis was thoroughly discussed with the patient and questions answered. The benefits, risks, alternatives and complications were also discussed. The patient understands and wishes to proceed with the procedure. Written consent was obtained. Ultrasound was performed to localize and mark an adequate pocket of fluid in the left chest. The area was then prepped and draped in the normal sterile fashion. 1% Lidocaine was used for local anesthesia. Under ultrasound guidance a 6 Fr Safe-T-Centesis catheter was introduced. Thoracentesis was performed. The catheter was removed and a dressing applied. FINDINGS: A total of approximately 1.5 L of slightly hazy, yellow fluid was removed. Samples were sent to the laboratory as requested by the clinical team. IMPRESSION: Successful ultrasound guided left thoracentesis yielding 1.5 L of pleural fluid. Read by: Brayton El PA-C Electronically Signed   By: Irish Lack M.D.   On: 06/12/2018 10:36    Assessment/Plan Active Problems:   Acute on chronic respiratory failure with hypoxia (HCC)   Acute metabolic encephalopathy   Cardiac arrest (HCC)   Pleural effusion   Alcohol abuse with alcohol-induced disorder (HCC)   1. Acute on chronic respiratory failure with hypoxia we will continue with T bar weaning continue pulmonary toilet supportive care.  Secretions are fair to moderate. 2. Acute metabolic encephalopathy unchanged we will continue with present therapy 3. Cardiac arrest rhythm is stable 4. Pleural effusion follow x-rays 5. Alcohol abuse no active withdrawal   I  have personally seen and evaluated the patient, evaluated laboratory and imaging results, formulated the assessment and plan and placed orders. The Patient requires high complexity decision making for assessment and support.  Case was discussed on Rounds with the Respiratory Therapy Staff  Yevonne Pax, MD [facility] Pulmonary Critical Care Medicine Sleep Medicine

## 2018-06-13 ENCOUNTER — Other Ambulatory Visit (HOSPITAL_COMMUNITY): Payer: Medicare Other

## 2018-06-13 ENCOUNTER — Other Ambulatory Visit (HOSPITAL_COMMUNITY): Payer: Self-pay

## 2018-06-13 DIAGNOSIS — G9341 Metabolic encephalopathy: Secondary | ICD-10-CM | POA: Diagnosis not present

## 2018-06-13 DIAGNOSIS — F1019 Alcohol abuse with unspecified alcohol-induced disorder: Secondary | ICD-10-CM | POA: Diagnosis not present

## 2018-06-13 DIAGNOSIS — N170 Acute kidney failure with tubular necrosis: Secondary | ICD-10-CM | POA: Diagnosis not present

## 2018-06-13 DIAGNOSIS — J9621 Acute and chronic respiratory failure with hypoxia: Secondary | ICD-10-CM | POA: Diagnosis not present

## 2018-06-13 LAB — GLUCOSE, PLEURAL OR PERITONEAL FLUID: Glucose, Fluid: 177 mg/dL

## 2018-06-13 LAB — AMYLASE, PLEURAL OR PERITONEAL FLUID: AMYLASE FL: 7 U/L

## 2018-06-13 LAB — GRAM STAIN

## 2018-06-13 MED ORDER — LIDOCAINE HCL (PF) 1 % IJ SOLN
INTRAMUSCULAR | Status: AC
  Filled 2018-06-13: qty 30

## 2018-06-13 NOTE — Procedures (Signed)
   Portable US guided RLQ paracentesis  6.8 L clear yellow fluid Sent for labs per MD  Tolerated well

## 2018-06-13 NOTE — Progress Notes (Signed)
Pulmonary Critical Care Medicine [facility] [place]   PULMONARY CRITICAL CARE SERVICE  PROGRESS NOTE  Date of Service: 06/13/2018  Nathan Short  XLK:[id number]  DOB: February 29, 1961   DOA: 05/10/2018  Referring Physician: Carron Curie, MD  HPI: Nathan Short is a 58 y.o. male seen for follow up of Acute on Chronic Respiratory Failure.  Patient is on T collar has been off the ventilator for more than 48 hours.  He underwent a paracentesis today approximately 6.8 L of fluid was removed and his breathing does seem to be a little bit better since the procedure was done.  Medications: Reviewed on Rounds  Physical Exam:  Vitals: Temperature 98.1 pulse 91 respiratory rate 17 blood pressure 110/74 saturations 94%  Ventilator Settings off the ventilator on T collar  . General: Comfortable at this time . Eyes: Grossly normal lids, irises & conjunctiva . ENT: grossly tongue is normal . Neck: no obvious mass . Cardiovascular: S1 S2 normal no gallop . Respiratory: No rhonchi coarse breath sounds . Abdomen: soft . Skin: no rash seen on limited exam . Musculoskeletal: not rigid . Psychiatric:unable to assess . Neurologic: no seizure no involuntary movements         Lab Data:   Basic Metabolic Panel: Recent Labs  Lab 06/07/18 0547 06/09/18 0547 06/10/18 0731 06/12/18 0655  NA 143 142  --  141  K 3.6 4.3  --  4.1  CL 99 101  --  97*  CO2 34* 32  --  35*  GLUCOSE 210* 190*  --  179*  BUN 18 20  --  29*  CREATININE 1.13 1.06  --  1.15  CALCIUM 8.4* 8.6*  --  8.4*  MG 1.8 1.4* 1.7 1.7  PHOS 3.3  --   --   --     ABG: No results for input(s): PHART, PCO2ART, PO2ART, HCO3, O2SAT in the last 168 hours.  Liver Function Tests: Recent Labs  Lab 06/07/18 0547  ALBUMIN 1.9*   No results for input(s): LIPASE, AMYLASE in the last 168 hours. Recent Labs  Lab 06/10/18 0731  AMMONIA 32    CBC: Recent Labs  Lab 06/07/18 0547 06/09/18 0547 06/12/18 0631  WBC  3.5* 3.0* 3.4*  HGB 9.0* 10.3* 9.2*  HCT 30.3* 34.3* 31.2*  MCV 99.0 99.1 98.7  PLT 127* 133* 123*    Cardiac Enzymes: No results for input(s): CKTOTAL, CKMB, CKMBINDEX, TROPONINI in the last 168 hours.  BNP (last 3 results) No results for input(s): BNP in the last 8760 hours.  ProBNP (last 3 results) No results for input(s): PROBNP in the last 8760 hours.  Radiological Exams: US Abdomen Limited  Result Date: 06/12/2018 CLINICAL DATA:  Alcohol abuse. EXAM: LIMITED ABDOMEN ULTRASOUND FOR ASCITES TECHNIQUE: Limited ultrasound survey for ascites was performed in all four abdominal quadrants. COMPARISON:  No prior. FINDINGS: Prominent amount of ascites noted. IMPRESSION: Prominent ascites. Electronically Signed   By: Maisie Fus  Register   On: 06/12/2018 10:38   US Paracentesis  Result Date: 06/13/2018 INDICATION: Recurrent ascites EXAM: ULTRASOUND-GUIDED PARACENTESIS PORTABLE COMPARISON:  None. MEDICATIONS: 10 cc 1% lidocaine COMPLICATIONS: None immediate. TECHNIQUE: Informed written consent was obtained from the patient after a discussion of the risks, benefits and alternatives to treatment. A timeout was performed prior to the initiation of the procedure. Initial ultrasound scanning demonstrates a large amount of ascites within the right lower abdominal quadrant. The right lower abdomen was prepped and draped in the usual sterile fashion. 1% lidocaine  with epinephrine was used for local anesthesia. Under direct ultrasound guidance, a 19 gauge, 7-cm, Yueh catheter was introduced. An ultrasound image was saved for documentation purposed. The paracentesis was performed. The catheter was removed and a dressing was applied. The patient tolerated the procedure well without immediate post procedural complication. FINDINGS: A total of approximately 6.8 liters of yellow fluid was removed. Samples were sent to the laboratory as requested by the clinical team. IMPRESSION: Successful ultrasound-guided  paracentesis yielding 6.8 liters of peritoneal fluid. Read by Robet Leu [facility] Electronically Signed   By: Irish Lack M.D.   On: 06/13/2018 12:06   Dg Chest Port 1 View  Result Date: 06/12/2018 CLINICAL DATA:  Status post left thoracentesis EXAM: PORTABLE CHEST 1 VIEW COMPARISON:  06/13/2018 FINDINGS: Near complete resolution of the left pleural effusion following thoracentesis. There is improvement in the left lung aeration with some residual left lower lobe retrocardiac collapse/consolidation. Negative for pneumothorax. Low lung volumes persist. Heart remains enlarged with vascular congestion. Degenerative changes noted spine. IMPRESSION: Near complete resolution of the left effusion following thoracentesis. No pneumothorax Residual left lower lobe retrocardiac atelectasis/consolidation Electronically Signed   By: Judie Petit.  Shick M.D.   On: 06/12/2018 10:30   US Thoracentesis Asp Pleural Space W/img Guide  Result Date: 06/12/2018 INDICATION: Shortness of breath. Left-sided pleural effusion. Request for diagnostic and therapeutic thoracentesis. EXAM: ULTRASOUND GUIDED LEFT THORACENTESIS MEDICATIONS: None. COMPLICATIONS: None immediate. PROCEDURE: An ultrasound guided thoracentesis was thoroughly discussed with the patient and questions answered. The benefits, risks, alternatives and complications were also discussed. The patient understands and wishes to proceed with the procedure. Written consent was obtained. Ultrasound was performed to localize and mark an adequate pocket of fluid in the left chest. The area was then prepped and draped in the normal sterile fashion. 1% Lidocaine was used for local anesthesia. Under ultrasound guidance a 6 Fr Safe-T-Centesis catheter was introduced. Thoracentesis was performed. The catheter was removed and a dressing applied. FINDINGS: A total of approximately 1.5 L of slightly hazy, yellow fluid was removed. Samples were sent to the laboratory as requested by the  clinical team. IMPRESSION: Successful ultrasound guided left thoracentesis yielding 1.5 L of pleural fluid. Read by: Brayton El PA-C Electronically Signed   By: Irish Lack M.D.   On: 06/12/2018 10:36    Assessment/Plan Active Problems:   Acute on chronic respiratory failure with hypoxia (HCC)   Acute metabolic encephalopathy   Cardiac arrest (HCC)   Pleural effusion   Alcohol abuse with alcohol-induced disorder (HCC)   1. Acute on chronic respiratory failure with hypoxia we will continue with the T collar trials has been off the vent for 48 hours 2. Acute metabolic encephalopathy clinically doing about the same we will continue with supportive care 3. Cardiac arrest rhythm is stable 4. Pleural effusions hopefully will see some improvement patient had a thoracentesis done also with removal of 1.5 L of pleural fluid 5. Alcohol abuse stable no active withdrawal is noted at this time   I have personally seen and evaluated the patient, evaluated laboratory and imaging results, formulated the assessment and plan and placed orders. The Patient requires high complexity decision making for assessment and support.  Case was discussed on Rounds with the Respiratory Therapy Staff  Yevonne Pax, MD [facility] Pulmonary Critical Care Medicine Sleep Medicine

## 2018-06-14 DIAGNOSIS — J9621 Acute and chronic respiratory failure with hypoxia: Secondary | ICD-10-CM | POA: Diagnosis not present

## 2018-06-14 DIAGNOSIS — F1019 Alcohol abuse with unspecified alcohol-induced disorder: Secondary | ICD-10-CM | POA: Diagnosis not present

## 2018-06-14 DIAGNOSIS — N170 Acute kidney failure with tubular necrosis: Secondary | ICD-10-CM | POA: Diagnosis not present

## 2018-06-14 DIAGNOSIS — G9341 Metabolic encephalopathy: Secondary | ICD-10-CM | POA: Diagnosis not present

## 2018-06-14 NOTE — Progress Notes (Signed)
[facility] [place]   PULMONARY CRITICAL CARE SERVICE  PROGRESS NOTE  Date of Service: 06/14/2018  Lucky RathkeJeffrey D Schertzer  ZOX:[id number]  DOB: 02-08-1961   DOA: Apr 10, 2018  Referring Physician: Carron CurieAli Hijazi, MD  HPI: Lucky RathkeJeffrey D Zbikowski is a 58 y.o. male seen for follow up of Acute on Chronic Respiratory Failure.  Patient is on T collar at this time started on PMV which he is tolerating well  Medications: Reviewed on Rounds  Physical Exam:  Vitals: Temperature 97.9 pulse 98 respiratory 21 blood pressure 101/56 saturations 93%  Ventilator Settings off the ventilator on T collar  . General: Comfortable at this time . Eyes: Grossly normal lids, irises & conjunctiva . ENT: grossly tongue is normal . Neck: no obvious mass . Cardiovascular: S1 S2 normal no gallop . Respiratory: Coarse breath sounds few scattered rhonchi . Abdomen: soft . Skin: no rash seen on limited exam . Musculoskeletal: not rigid . Psychiatric:unable to assess . Neurologic: no seizure no involuntary movements         Lab Data:   Basic Metabolic Panel: Recent Labs  Lab 06/09/18 0547 06/10/18 0731 06/12/18 0655  NA 142  --  141  K 4.3  --  4.1  CL 101  --  97*  CO2 32  --  35*  GLUCOSE 190*  --  179*  BUN 20  --  29*  CREATININE 1.06  --  1.15  CALCIUM 8.6*  --  8.4*  MG 1.4* 1.7 1.7    ABG: No results for input(s): PHART, PCO2ART, PO2ART, HCO3, O2SAT in the last 168 hours.  Liver Function Tests: No results for input(s): AST, ALT, ALKPHOS, BILITOT, PROT, ALBUMIN in the last 168 hours. No results for input(s): LIPASE, AMYLASE in the last 168 hours. Recent Labs  Lab 06/10/18 0731  AMMONIA 32    CBC: Recent Labs  Lab 06/09/18 0547 06/12/18 0631  WBC 3.0* 3.4*  HGB 10.3* 9.2*  HCT 34.3* 31.2*  MCV 99.1 98.7  PLT 133* 123*    Cardiac Enzymes: No results for input(s): CKTOTAL, CKMB, CKMBINDEX, TROPONINI in the last 168 hours.  BNP (last 3  results) No results for input(s): BNP in the last 8760 hours.  ProBNP (last 3 results) No results for input(s): PROBNP in the last 8760 hours.  Radiological Exams: [place] Paracentesis  Result Date: 06/13/2018 INDICATION: Recurrent ascites EXAM: ULTRASOUND-GUIDED PARACENTESIS PORTABLE COMPARISON:  None. MEDICATIONS: 10 cc 1% lidocaine COMPLICATIONS: None immediate. TECHNIQUE: Informed written consent was obtained from the patient after a discussion of the risks, benefits and alternatives to treatment. A timeout was performed prior to the initiation of the procedure. Initial ultrasound scanning demonstrates a large amount of ascites within the right lower abdominal quadrant. The right lower abdomen was prepped and draped in the usual sterile fashion. 1% lidocaine with epinephrine was used for local anesthesia. Under direct ultrasound guidance, a 19 gauge, 7-cm, Yueh catheter was introduced. An ultrasound image was saved for documentation purposed. The paracentesis was performed. The catheter was removed and a dressing was applied. The patient tolerated the procedure well without immediate post procedural complication. FINDINGS: A total of approximately 6.8 liters of yellow fluid was removed. Samples were sent to the laboratory as requested by the clinical team. IMPRESSION: Successful ultrasound-guided paracentesis yielding 6.8 liters of peritoneal fluid. Read by Robet LeuPamela A Turpin [facility] Electronically Signed   By: Irish LackGlenn  Yamagata M.D.   On: 06/13/2018 12:06    Assessment/Plan Active Problems:  Acute on chronic respiratory failure with hypoxia (HCC)   Acute metabolic encephalopathy   Cardiac arrest (HCC)   Pleural effusion   Alcohol abuse with alcohol-induced disorder (HCC)   1. Acute on chronic respiratory failure with hypoxia we will continue with T collar trials continue pulmonary toilet supportive care. 2. Acute metabolic encephalopathy grossly unchanged we will continue present management 3. Cardiac  arrest rhythm has been stable 4. Pleural effusion at baseline we will continue present management 5. Alcohol abuse no active withdrawal is noted   I have personally seen and evaluated the patient, evaluated laboratory and imaging results, formulated the assessment and plan and placed orders. The Patient requires high complexity decision making for assessment and support.  Case was discussed on Rounds with the Respiratory Therapy Staff  Yevonne PaxSaadat A Shamiya Demeritt, MD [facility] Pulmonary Critical Care Medicine Sleep Medicine

## 2018-06-15 DIAGNOSIS — J9621 Acute and chronic respiratory failure with hypoxia: Secondary | ICD-10-CM | POA: Diagnosis not present

## 2018-06-15 DIAGNOSIS — N170 Acute kidney failure with tubular necrosis: Secondary | ICD-10-CM | POA: Diagnosis not present

## 2018-06-15 DIAGNOSIS — G9341 Metabolic encephalopathy: Secondary | ICD-10-CM | POA: Diagnosis not present

## 2018-06-15 DIAGNOSIS — F1019 Alcohol abuse with unspecified alcohol-induced disorder: Secondary | ICD-10-CM | POA: Diagnosis not present

## 2018-06-15 LAB — COMPREHENSIVE METABOLIC PANEL
ALBUMIN: 2 g/dL — AB (ref 3.5–5.0)
ALT: 14 U/L (ref 0–44)
AST: 28 U/L (ref 15–41)
Alkaline Phosphatase: 113 U/L (ref 38–126)
Anion gap: 9 (ref 5–15)
BUN: 23 mg/dL — AB (ref 6–20)
CALCIUM: 8.7 mg/dL — AB (ref 8.9–10.3)
CO2: 32 mmol/L (ref 22–32)
Chloride: 98 mmol/L (ref 98–111)
Creatinine, Ser: 1.2 mg/dL (ref 0.61–1.24)
GFR calc Af Amer: >60 mL/min (ref >60)
GFR calc non Af Amer: >60 mL/min (ref >60)
GLUCOSE: 151 mg/dL — AB (ref 70–99)
Potassium: 4.3 mmol/L (ref 3.5–5.1)
SODIUM: 139 mmol/L (ref 135–145)
Total Bilirubin: 1.2 mg/dL (ref 0.3–1.2)
Total Protein: 6.4 g/dL — ABNORMAL LOW (ref 6.5–8.1)

## 2018-06-15 LAB — CBC
HCT: 30.7 % — ABNORMAL LOW (ref 39.0–52.0)
Hemoglobin: 9.9 g/dL — ABNORMAL LOW (ref 13.0–17.0)
MCH: 30.7 pg (ref 26.0–34.0)
MCHC: 32.2 g/dL (ref 30.0–36.0)
MCV: 95 fL (ref 80.0–100.0)
NRBC: 0 % (ref 0.0–0.2)
Platelets: 116 10*3/uL — ABNORMAL LOW (ref 150–400)
RBC: 3.23 MIL/uL — ABNORMAL LOW (ref 4.22–5.81)
RDW: 15.1 % (ref 11.5–15.5)
WBC: 4.3 10*3/uL (ref 4.0–10.5)

## 2018-06-15 LAB — PH, BODY FLUID: pH, Body Fluid: 7.7

## 2018-06-15 LAB — AMMONIA: Ammonia: 52 umol/L — ABNORMAL HIGH (ref 9–35)

## 2018-06-15 NOTE — Progress Notes (Signed)
Pulmonary Critical Care Medicine [facility] [place]   PULMONARY CRITICAL CARE SERVICE  PROGRESS NOTE  Date of Service: 06/15/2018  Nathan Short  OLI:[id number]  DOB: [birth year]   DOA: 05/29/2018  Referring Physician: Carron Curie, MD  HPI: Nathan Short is a 58 y.o. male seen for follow up of Acute on Chronic Respiratory Failure.  Patient is on T collar right now 28% FiO2 is tolerating the PMV fairly well  Medications: Reviewed on Rounds  Physical Exam:  Vitals: Temperature 97.0 pulse 98 respiratory 22 blood pressure 109/81 saturations 94%  Ventilator Settings currently on T collar FiO2 28%  . General: Comfortable at this time . Eyes: Grossly normal lids, irises & conjunctiva . ENT: grossly tongue is normal . Neck: no obvious mass . Cardiovascular: S1 S2 normal no gallop . Respiratory: Coarse breath sounds no rhonchi . Abdomen: soft . Skin: no rash seen on limited exam . Musculoskeletal: not rigid . Psychiatric:unable to assess . Neurologic: no seizure no involuntary movements         Lab Data:   Basic Metabolic Panel: Recent Labs  Lab 06/09/18 0547 06/10/18 0731 06/12/18 0655 06/15/18 0800  NA 142  --  141 139  K 4.3  --  4.1 4.3  CL 101  --  97* 98  CO2 32  --  35* 32  GLUCOSE 190*  --  179* 151*  BUN 20  --  29* 23*  CREATININE 1.06  --  1.15 1.20  CALCIUM 8.6*  --  8.4* 8.7*  MG 1.4* 1.7 1.7  --     ABG: No results for input(s): PHART, PCO2ART, PO2ART, HCO3, O2SAT in the last 168 hours.  Liver Function Tests: Recent Labs  Lab 06/15/18 0800  AST 28  ALT 14  ALKPHOS 113  BILITOT 1.2  PROT 6.4*  ALBUMIN 2.0*   No results for input(s): LIPASE, AMYLASE in the last 168 hours. Recent Labs  Lab 06/10/18 0731 06/15/18 0800  AMMONIA 32 52*    CBC: Recent Labs  Lab 06/09/18 0547 06/12/18 0631 06/15/18 0800  WBC 3.0* 3.4* 4.3  HGB 10.3* 9.2* 9.9*  HCT 34.3* 31.2* 30.7*  MCV 99.1 98.7 95.0  PLT 133* 123* 116*     Cardiac Enzymes: No results for input(s): CKTOTAL, CKMB, CKMBINDEX, TROPONINI in the last 168 hours.  BNP (last 3 results) No results for input(s): BNP in the last 8760 hours.  ProBNP (last 3 results) No results for input(s): PROBNP in the last 8760 hours.  Radiological Exams: No results found.  Assessment/Plan Active Problems:   Acute on chronic respiratory failure with hypoxia (HCC)   Acute metabolic encephalopathy   Cardiac arrest (HCC)   Pleural effusion   Alcohol abuse with alcohol-induced disorder (HCC)   1. Acute on chronic respiratory failure with hypoxia we will continue with T collar trials titrate oxygen continue pulmonary toilet 2. Acute metabolic encephalopathy grossly unchanged 3. Cardiac arrest rhythm is stable 4. Pleural effusion follow x-rays as needed 5. Alcohol abuse no active withdrawal is noted   I have personally seen and evaluated the patient, evaluated laboratory and imaging results, formulated the assessment and plan and placed orders. The Patient requires high complexity decision making for assessment and support.  Case was discussed on Rounds with the Respiratory Therapy Staff  Yevonne Pax, MD [facility] Pulmonary Critical Care Medicine Sleep Medicine

## 2018-06-16 DIAGNOSIS — J9621 Acute and chronic respiratory failure with hypoxia: Secondary | ICD-10-CM | POA: Diagnosis not present

## 2018-06-16 DIAGNOSIS — F1019 Alcohol abuse with unspecified alcohol-induced disorder: Secondary | ICD-10-CM | POA: Diagnosis not present

## 2018-06-16 DIAGNOSIS — N170 Acute kidney failure with tubular necrosis: Secondary | ICD-10-CM | POA: Diagnosis not present

## 2018-06-16 DIAGNOSIS — G9341 Metabolic encephalopathy: Secondary | ICD-10-CM | POA: Diagnosis not present

## 2018-06-16 NOTE — Progress Notes (Signed)
Pulmonary Critical Care Medicine [facility] [place]   PULMONARY CRITICAL CARE SERVICE  PROGRESS NOTE  Date of Service: 06/16/2018  TEMPLE SCHANKE  [id number]:[id number]  DOB: 12-Apr-1961   DOA: 06-05-2018  Referring Physician: Carron Curie, MD  HPI: Nathan Short is a 58 y.o. male seen for follow up of Acute on Chronic Respiratory Failure.  Patient currently is on T collar has been tolerating PMV fairly well also.  Medications: Reviewed on Rounds  Physical Exam:  Vitals: Temperature 97.9 pulse 97 respiratory 22 blood pressure 113/64 saturation 97%  Ventilator Settings currently is on T collar FiO2 28%  . General: Comfortable at this time . Eyes: Grossly normal lids, irises & conjunctiva . ENT: grossly tongue is normal . Neck: no obvious mass . Cardiovascular: S1 S2 normal no gallop . Respiratory: No rhonchi or rales are noted at this time . Abdomen: soft . Skin: no rash seen on limited exam . Musculoskeletal: not rigid . Psychiatric:unable to assess . Neurologic: no seizure no involuntary movements         Lab Data:   Basic Metabolic Panel: Recent Labs  Lab 06/10/18 0731 06/12/18 0655 06/15/18 0800  NA  --  141 139  K  --  4.1 4.3  CL  --  97* 98  CO2  --  35* 32  GLUCOSE  --  179* 151*  BUN  --  29* 23*  CREATININE  --  1.15 1.20  CALCIUM  --  8.4* 8.7*  MG 1.7 1.7  --     ABG: No results for input(s): PHART, PCO2ART, PO2ART, HCO3, O2SAT in the last 168 hours.  Liver Function Tests: Recent Labs  Lab 06/15/18 0800  AST 28  ALT 14  ALKPHOS 113  BILITOT 1.2  PROT 6.4*  ALBUMIN 2.0*   No results for input(s): LIPASE, AMYLASE in the last 168 hours. Recent Labs  Lab 06/10/18 0731 06/15/18 0800  AMMONIA 32 52*    CBC: Recent Labs  Lab 06/12/18 0631 06/15/18 0800  WBC 3.4* 4.3  HGB 9.2* 9.9*  HCT 31.2* 30.7*  MCV 98.7 95.0  PLT 123* 116*    Cardiac Enzymes: No results for input(s): CKTOTAL, CKMB, CKMBINDEX, TROPONINI in the  last 168 hours.  BNP (last 3 results) No results for input(s): BNP in the last 8760 hours.  ProBNP (last 3 results) No results for input(s): PROBNP in the last 8760 hours.  Radiological Exams: No results found.  Assessment/Plan Active Problems:   Acute on chronic respiratory failure with hypoxia (HCC)   Acute metabolic encephalopathy   Cardiac arrest (HCC)   Pleural effusion   Alcohol abuse with alcohol-induced disorder (HCC)   1. Acute on chronic respiratory failure with hypoxia we will continue with the T collar wean with the PMV in place 2. Metabolic encephalopathy at baseline continue supportive care 3. Cardiac arrest rhythm is stable 4. Pleural effusion at baseline 5. Alcohol abuse no active withdrawal is noted at this time   I have personally seen and evaluated the patient, evaluated laboratory and imaging results, formulated the assessment and plan and placed orders. The Patient requires high complexity decision making for assessment and support.  Case was discussed on Rounds with the Respiratory Therapy Staff  Yevonne Pax, MD [facility] Pulmonary Critical Care Medicine Sleep Medicine

## 2018-06-17 ENCOUNTER — Other Ambulatory Visit (HOSPITAL_COMMUNITY): Payer: Self-pay

## 2018-06-17 DIAGNOSIS — J9621 Acute and chronic respiratory failure with hypoxia: Secondary | ICD-10-CM | POA: Diagnosis not present

## 2018-06-17 DIAGNOSIS — G9341 Metabolic encephalopathy: Secondary | ICD-10-CM | POA: Diagnosis not present

## 2018-06-17 DIAGNOSIS — F1019 Alcohol abuse with unspecified alcohol-induced disorder: Secondary | ICD-10-CM | POA: Diagnosis not present

## 2018-06-17 DIAGNOSIS — N170 Acute kidney failure with tubular necrosis: Secondary | ICD-10-CM | POA: Diagnosis not present

## 2018-06-17 LAB — CBC
HCT: 33.2 % — ABNORMAL LOW (ref 39.0–52.0)
Hemoglobin: 10.1 g/dL — ABNORMAL LOW (ref 13.0–17.0)
MCH: 29.6 pg (ref 26.0–34.0)
MCHC: 30.4 g/dL (ref 30.0–36.0)
MCV: 97.4 fL (ref 80.0–100.0)
Platelets: 111 10*3/uL — ABNORMAL LOW (ref 150–400)
RBC: 3.41 MIL/uL — ABNORMAL LOW (ref 4.22–5.81)
RDW: 15.2 % (ref 11.5–15.5)
WBC: 4.2 10*3/uL (ref 4.0–10.5)
nRBC: 0 % (ref 0.0–0.2)

## 2018-06-17 LAB — BASIC METABOLIC PANEL
Anion gap: 9 (ref 5–15)
BUN: 25 mg/dL — ABNORMAL HIGH (ref 6–20)
CALCIUM: 8.9 mg/dL (ref 8.9–10.3)
CO2: 35 mmol/L — ABNORMAL HIGH (ref 22–32)
CREATININE: 1.18 mg/dL (ref 0.61–1.24)
Chloride: 98 mmol/L (ref 98–111)
GFR calc Af Amer: >60 mL/min (ref >60)
GFR calc non Af Amer: >60 mL/min (ref >60)
Glucose, Bld: 152 mg/dL — ABNORMAL HIGH (ref 70–99)
Potassium: 4.7 mmol/L (ref 3.5–5.1)
SODIUM: 142 mmol/L (ref 135–145)

## 2018-06-17 LAB — CULTURE, BODY FLUID W GRAM STAIN -BOTTLE: Culture: NO GROWTH

## 2018-06-17 LAB — AMMONIA: Ammonia: 29 umol/L (ref 9–35)

## 2018-06-17 LAB — PHOSPHORUS: Phosphorus: 4.4 mg/dL (ref 2.5–4.6)

## 2018-06-17 LAB — MAGNESIUM: Magnesium: 1.6 mg/dL — ABNORMAL LOW (ref 1.7–2.4)

## 2018-06-17 NOTE — Progress Notes (Signed)
Pulmonary Critical Care Medicine [facility] [place]   PULMONARY CRITICAL CARE SERVICE  PROGRESS NOTE  Date of Service: 06/17/2018  Nathan Short  [id number]  DOB: [birth year]   DOA: 28-Jun-2018  Referring Physician: Carron Curie, MD  HPI: Nathan Short is a 58 y.o. male seen for follow up of Acute on Chronic Respiratory Failure.  Patient currently is on T collar had a swallowing study done which the patient was able to pass.  Patient is tolerating PMV fairly well also  Medications: Reviewed on Rounds  Physical Exam:  Vitals: Temperature 98.0 pulse 100 respiratory 30 blood pressure 111/70 saturations 95%  Ventilator Settings off the ventilator right now on T collar FiO2 28%  . General: Comfortable at this time . Eyes: Grossly normal lids, irises & conjunctiva . ENT: grossly tongue is normal . Neck: no obvious mass . Cardiovascular: S1 S2 normal no gallop . Respiratory: No rhonchi or rales are noted at this time . Abdomen: soft . Skin: no rash seen on limited exam . Musculoskeletal: not rigid . Psychiatric:unable to assess . Neurologic: no seizure no involuntary movements         Lab Data:   Basic Metabolic Panel: Recent Labs  Lab 06/12/18 0655 06/15/18 0800 06/17/18 0725  NA 141 139 142  K 4.1 4.3 4.7  CL 97* 98 98  CO2 35* 32 35*  GLUCOSE 179* 151* 152*  BUN 29* 23* 25*  CREATININE 1.15 1.20 1.18  CALCIUM 8.4* 8.7* 8.9  MG 1.7  --  1.6*  PHOS  --   --  4.4    ABG: No results for input(s): PHART, PCO2ART, PO2ART, HCO3, O2SAT in the last 168 hours.  Liver Function Tests: Recent Labs  Lab 06/15/18 0800  AST 28  ALT 14  ALKPHOS 113  BILITOT 1.2  PROT 6.4*  ALBUMIN 2.0*   No results for input(s): LIPASE, AMYLASE in the last 168 hours. Recent Labs  Lab 06/15/18 0800 06/17/18 0725  AMMONIA 52* 29    CBC: Recent Labs  Lab 06/12/18 0631 06/15/18 0800 06/17/18 0725  WBC 3.4* 4.3 4.2  HGB 9.2* 9.9* 10.1*  HCT 31.2* 30.7*  33.2*  MCV 98.7 95.0 97.4  PLT 123* 116* 111*    Cardiac Enzymes: No results for input(s): CKTOTAL, CKMB, CKMBINDEX, TROPONINI in the last 168 hours.  BNP (last 3 results) No results for input(s): BNP in the last 8760 hours.  ProBNP (last 3 results) No results for input(s): PROBNP in the last 8760 hours.  Radiological Exams: No results found.  Assessment/Plan Active Problems:   Acute on chronic respiratory failure with hypoxia (HCC)   Acute metabolic encephalopathy   Cardiac arrest (HCC)   Pleural effusion   Alcohol abuse with alcohol-induced disorder (HCC)   1. Acute on chronic respiratory failure with hypoxia we will continue with T collar trials continue secretion management pulmonary toilet. 2. Acute metabolic encephalopathy grossly unchanged we will continue with supportive care 3. Cardiac arrest rhythm is been stable 4. Pleural effusions at baseline follow x-rays 5. Alcohol abuse no active withdrawal is noted at this time   I have personally seen and evaluated the patient, evaluated laboratory and imaging results, formulated the assessment and plan and placed orders. The Patient requires high complexity decision making for assessment and support.  Case was discussed on Rounds with the Respiratory Therapy Staff  Yevonne Pax, MD [facility] Pulmonary Critical Care Medicine Sleep Medicine

## 2018-06-18 DIAGNOSIS — N170 Acute kidney failure with tubular necrosis: Secondary | ICD-10-CM | POA: Diagnosis not present

## 2018-06-18 DIAGNOSIS — F1019 Alcohol abuse with unspecified alcohol-induced disorder: Secondary | ICD-10-CM | POA: Diagnosis not present

## 2018-06-18 DIAGNOSIS — G9341 Metabolic encephalopathy: Secondary | ICD-10-CM | POA: Diagnosis not present

## 2018-06-18 DIAGNOSIS — J9621 Acute and chronic respiratory failure with hypoxia: Secondary | ICD-10-CM | POA: Diagnosis not present

## 2018-06-18 LAB — BASIC METABOLIC PANEL
Anion gap: 8 (ref 5–15)
BUN: 26 mg/dL — ABNORMAL HIGH (ref 6–20)
CO2: 32 mmol/L (ref 22–32)
Calcium: 8.1 mg/dL — ABNORMAL LOW (ref 8.9–10.3)
Chloride: 94 mmol/L — ABNORMAL LOW (ref 98–111)
Creatinine, Ser: 1.25 mg/dL — ABNORMAL HIGH (ref 0.61–1.24)
GFR calc Af Amer: >60 mL/min (ref >60)
GFR calc non Af Amer: >60 mL/min (ref >60)
Glucose, Bld: 174 mg/dL — ABNORMAL HIGH (ref 70–99)
Potassium: 4.5 mmol/L (ref 3.5–5.1)
Sodium: 134 mmol/L — ABNORMAL LOW (ref 135–145)

## 2018-06-18 LAB — CULTURE, BODY FLUID W GRAM STAIN -BOTTLE

## 2018-06-18 LAB — MAGNESIUM: Magnesium: 1.7 mg/dL (ref 1.7–2.4)

## 2018-06-18 LAB — CULTURE, BODY FLUID-BOTTLE: CULTURE: NO GROWTH

## 2018-06-18 NOTE — Progress Notes (Signed)
Pulmonary Critical Care Medicine [facility] [place]   PULMONARY CRITICAL CARE SERVICE  PROGRESS NOTE  Date of Service: 06/18/2018  Nathan Short  [id number]  DOB: 27-May-1961   DOA: 05/26/2018  Referring Physician: Carron Curie, MD  HPI: Nathan Short is a 58 y.o. male seen for follow up of Acute on Chronic Respiratory Failure.  At this time patient is on T collar and tolerating PMV he is looking good we should be able to start capping trials  Medications: Reviewed on Rounds  Physical Exam:  Vitals: Temperature 97.8 pulse 86 respiratory 14 blood pressure 118/71 saturations 99%  Ventilator Settings currently on T collar FiO2 28% with the PMV  . General: Comfortable at this time . Eyes: Grossly normal lids, irises & conjunctiva . ENT: grossly tongue is normal . Neck: no obvious mass . Cardiovascular: S1 S2 normal no gallop . Respiratory: Scattered distant rhonchi . Abdomen: soft . Skin: no rash seen on limited exam . Musculoskeletal: not rigid . Psychiatric:unable to assess . Neurologic: no seizure no involuntary movements         Lab Data:   Basic Metabolic Panel: Recent Labs  Lab 06/12/18 0655 06/15/18 0800 06/17/18 0725 06/18/18 0446  NA 141 139 142 134*  K 4.1 4.3 4.7 4.5  CL 97* 98 98 94*  CO2 35* 32 35* 32  GLUCOSE 179* 151* 152* 174*  BUN 29* 23* 25* 26*  CREATININE 1.15 1.20 1.18 1.25*  CALCIUM 8.4* 8.7* 8.9 8.1*  MG 1.7  --  1.6* 1.7  PHOS  --   --  4.4  --     ABG: No results for input(s): PHART, PCO2ART, PO2ART, HCO3, O2SAT in the last 168 hours.  Liver Function Tests: Recent Labs  Lab 06/15/18 0800  AST 28  ALT 14  ALKPHOS 113  BILITOT 1.2  PROT 6.4*  ALBUMIN 2.0*   No results for input(s): LIPASE, AMYLASE in the last 168 hours. Recent Labs  Lab 06/15/18 0800 06/17/18 0725  AMMONIA 52* 29    CBC: Recent Labs  Lab 06/12/18 0631 06/15/18 0800 06/17/18 0725  WBC 3.4* 4.3 4.2  HGB 9.2* 9.9* 10.1*  HCT  31.2* 30.7* 33.2*  MCV 98.7 95.0 97.4  PLT 123* 116* 111*    Cardiac Enzymes: No results for input(s): CKTOTAL, CKMB, CKMBINDEX, TROPONINI in the last 168 hours.  BNP (last 3 results) No results for input(s): BNP in the last 8760 hours.  ProBNP (last 3 results) No results for input(s): PROBNP in the last 8760 hours.  Radiological Exams: No results found.  Assessment/Plan Active Problems:   Acute on chronic respiratory failure with hypoxia (HCC)   Acute metabolic encephalopathy   Cardiac arrest (HCC)   Pleural effusion   Alcohol abuse with alcohol-induced disorder (HCC)   1. Acute on chronic respiratory failure with hypoxia we will continue with T collar and advanced to capping. 2. Acute metabolic encephalopathy grossly unchanged 3. Cardiac arrest rhythm is stable at this time 4. Pleural effusion follow x-rays 5. Alcohol abuse no active withdrawal   I have personally seen and evaluated the patient, evaluated laboratory and imaging results, formulated the assessment and plan and placed orders. The Patient requires high complexity decision making for assessment and support.  Case was discussed on Rounds with the Respiratory Therapy Staff  Yevonne Pax, MD [facility] Pulmonary Critical Care Medicine Sleep Medicine

## 2018-06-19 ENCOUNTER — Other Ambulatory Visit (HOSPITAL_COMMUNITY): Payer: Self-pay

## 2018-06-19 DIAGNOSIS — G9341 Metabolic encephalopathy: Secondary | ICD-10-CM | POA: Diagnosis not present

## 2018-06-19 DIAGNOSIS — F1019 Alcohol abuse with unspecified alcohol-induced disorder: Secondary | ICD-10-CM | POA: Diagnosis not present

## 2018-06-19 DIAGNOSIS — N170 Acute kidney failure with tubular necrosis: Secondary | ICD-10-CM | POA: Diagnosis not present

## 2018-06-19 DIAGNOSIS — J9621 Acute and chronic respiratory failure with hypoxia: Secondary | ICD-10-CM | POA: Diagnosis not present

## 2018-06-19 NOTE — Progress Notes (Signed)
Pulmonary Critical Care Medicine [facility] [place]   PULMONARY CRITICAL CARE SERVICE  PROGRESS NOTE  Date of Service: 06/19/2018  Nathan Short  DYJ:[id number]  DOB: 02-05-1961   DOA: 06/04/2018  Referring Physician: Carron Curie, MD  HPI: Nathan Short is a 58 y.o. male seen for follow up of Acute on Chronic Respiratory Failure.  Patient has been capping for more than 24 hours now  Medications: Reviewed on Rounds  Physical Exam:  Vitals: Temperature 98.2 pulse 108 respiratory 24 blood pressure 108/83 saturations 93%  Ventilator Settings capping off the ventilator  . General: Comfortable at this time . Eyes: Grossly normal lids, irises & conjunctiva . ENT: grossly tongue is normal . Neck: no obvious mass . Cardiovascular: S1 S2 normal no gallop . Respiratory: No rhonchi or rales are noted . Abdomen: soft . Skin: no rash seen on limited exam . Musculoskeletal: not rigid . Psychiatric:unable to assess . Neurologic: no seizure no involuntary movements         Lab Data:   Basic Metabolic Panel: Recent Labs  Lab 06/15/18 0800 06/17/18 0725 06/18/18 0446  NA 139 142 134*  K 4.3 4.7 4.5  CL 98 98 94*  CO2 32 35* 32  GLUCOSE 151* 152* 174*  BUN 23* 25* 26*  CREATININE 1.20 1.18 1.25*  CALCIUM 8.7* 8.9 8.1*  MG  --  1.6* 1.7  PHOS  --  4.4  --     ABG: No results for input(s): PHART, PCO2ART, PO2ART, HCO3, O2SAT in the last 168 hours.  Liver Function Tests: Recent Labs  Lab 06/15/18 0800  AST 28  ALT 14  ALKPHOS 113  BILITOT 1.2  PROT 6.4*  ALBUMIN 2.0*   No results for input(s): LIPASE, AMYLASE in the last 168 hours. Recent Labs  Lab 06/15/18 0800 06/17/18 0725  AMMONIA 52* 29    CBC: Recent Labs  Lab 06/15/18 0800 06/17/18 0725  WBC 4.3 4.2  HGB 9.9* 10.1*  HCT 30.7* 33.2*  MCV 95.0 97.4  PLT 116* 111*    Cardiac Enzymes: No results for input(s): CKTOTAL, CKMB, CKMBINDEX, TROPONINI in the last 168 hours.  BNP  (last 3 results) No results for input(s): BNP in the last 8760 hours.  ProBNP (last 3 results) No results for input(s): PROBNP in the last 8760 hours.  Radiological Exams: Dg Chest Port 1 View  Result Date: 06/19/2018 CLINICAL DATA:  Pleural effusion EXAM: PORTABLE CHEST 1 VIEW COMPARISON:  Chest radiograph 06/12/2018 FINDINGS: Tracheostomy tube terminates in the proximal trachea. Monitoring leads overlie the patient. Stable cardiomegaly. Interval removal of enteric tube. Interval increase in size of small layering left pleural effusion with underlying heterogeneous opacities. No pneumothorax. IMPRESSION: Interval increase in size of small layering left effusion with underlying opacities. No pneumothorax. Interval removal enteric tube. Electronically Signed   By: Annia Belt M.D.   On: 06/19/2018 09:09    Assessment/Plan Active Problems:   Acute on chronic respiratory failure with hypoxia (HCC)   Acute metabolic encephalopathy   Cardiac arrest (HCC)   Pleural effusion   Alcohol abuse with alcohol-induced disorder (HCC)   1. Acute on chronic respiratory failure with hypoxia we will continue with capping trials has completed 24 hours working towards eventual decannulation 2. Metabolic encephalopathy improving we will continue to follow 3. Cardiac arrest rhythm is stable 4. Pleural effusion follow x-rays there has been a small increase on the left pleural effusion based on latest chest x-ray. 5. Alcohol abuse no active withdrawal  is noted   I have personally seen and evaluated the patient, evaluated laboratory and imaging results, formulated the assessment and plan and placed orders. The Patient requires high complexity decision making for assessment and support.  Case was discussed on Rounds with the Respiratory Therapy Staff  Yevonne Pax, MD [facility] Pulmonary Critical Care Medicine Sleep Medicine

## 2018-06-20 DIAGNOSIS — G9341 Metabolic encephalopathy: Secondary | ICD-10-CM | POA: Diagnosis not present

## 2018-06-20 DIAGNOSIS — N170 Acute kidney failure with tubular necrosis: Secondary | ICD-10-CM | POA: Diagnosis not present

## 2018-06-20 DIAGNOSIS — J9621 Acute and chronic respiratory failure with hypoxia: Secondary | ICD-10-CM | POA: Diagnosis not present

## 2018-06-20 DIAGNOSIS — F1019 Alcohol abuse with unspecified alcohol-induced disorder: Secondary | ICD-10-CM | POA: Diagnosis not present

## 2018-06-20 LAB — PHOSPHORUS: PHOSPHORUS: 3.7 mg/dL (ref 2.5–4.6)

## 2018-06-20 LAB — CBC
HEMATOCRIT: 29 % — AB (ref 39.0–52.0)
Hemoglobin: 9.2 g/dL — ABNORMAL LOW (ref 13.0–17.0)
MCH: 29.7 pg (ref 26.0–34.0)
MCHC: 31.7 g/dL (ref 30.0–36.0)
MCV: 93.5 fL (ref 80.0–100.0)
Platelets: 89 10*3/uL — ABNORMAL LOW (ref 150–400)
RBC: 3.1 MIL/uL — ABNORMAL LOW (ref 4.22–5.81)
RDW: 15.2 % (ref 11.5–15.5)
WBC: 4 10*3/uL (ref 4.0–10.5)
nRBC: 0 % (ref 0.0–0.2)

## 2018-06-20 LAB — BASIC METABOLIC PANEL
Anion gap: 10 (ref 5–15)
BUN: 27 mg/dL — ABNORMAL HIGH (ref 6–20)
CO2: 29 mmol/L (ref 22–32)
Calcium: 8.3 mg/dL — ABNORMAL LOW (ref 8.9–10.3)
Chloride: 91 mmol/L — ABNORMAL LOW (ref 98–111)
Creatinine, Ser: 1.23 mg/dL (ref 0.61–1.24)
GFR calc non Af Amer: >60 mL/min (ref >60)
Glucose, Bld: 179 mg/dL — ABNORMAL HIGH (ref 70–99)
Potassium: 4.9 mmol/L (ref 3.5–5.1)
Sodium: 130 mmol/L — ABNORMAL LOW (ref 135–145)

## 2018-06-20 LAB — MAGNESIUM: Magnesium: 1.5 mg/dL — ABNORMAL LOW (ref 1.7–2.4)

## 2018-06-20 NOTE — Progress Notes (Signed)
[facility] [place]   PULMONARY CRITICAL CARE SERVICE  PROGRESS NOTE  Date of Service: 06/20/2018  Nathan Short  [id number]  DOB: 12/29/1960   DOA: 05/15/2018  Referring Physician: Carron CurieAli Hijazi, MD  HPI: Nathan RathkeJeffrey D Fermin is a 58 y.o. male seen for follow up of Acute on Chronic Respiratory Failure.  Patient is capping doing well at this time.  Secretions have been minimally  Medications: Reviewed on Rounds  Physical Exam:  Vitals: Temperature 97.3 pulse 107 respiratory 19 blood pressure is 128/65 saturations 93%  Ventilator Settings off the ventilator capping  . General: Comfortable at this time . Eyes: Grossly normal lids, irises & conjunctiva . ENT: grossly tongue is normal . Neck: no obvious mass . Cardiovascular: S1 S2 normal no gallop . Respiratory: Coarse rhonchi expansion equal . Abdomen: soft . Skin: no rash seen on limited exam . Musculoskeletal: not rigid . Psychiatric:unable to assess . Neurologic: no seizure no involuntary movements         Lab Data:   Basic Metabolic Panel: Recent Labs  Lab 06/15/18 0800 06/17/18 0725 06/18/18 0446 06/20/18 0758  NA 139 142 134* 130*  K 4.3 4.7 4.5 4.9  CL 98 98 94* 91*  CO2 32 35* 32 29  GLUCOSE 151* 152* 174* 179*  BUN 23* 25* 26* 27*  CREATININE 1.20 1.18 1.25* 1.23  CALCIUM 8.7* 8.9 8.1* 8.3*  MG  --  1.6* 1.7 1.5*  PHOS  --  4.4  --  3.7    ABG: No results for input(s): PHART, PCO2ART, PO2ART, HCO3, O2SAT in the last 168 hours.  Liver Function Tests: Recent Labs  Lab 06/15/18 0800  AST 28  ALT 14  ALKPHOS 113  BILITOT 1.2  PROT 6.4*  ALBUMIN 2.0*   No results for input(s): LIPASE, AMYLASE in the last 168 hours. Recent Labs  Lab 06/15/18 0800 06/17/18 0725  AMMONIA 52* 29    CBC: Recent Labs  Lab 06/15/18 0800 06/17/18 0725 06/20/18 0758  WBC 4.3 4.2 4.0  HGB 9.9* 10.1* 9.2*  HCT 30.7* 33.2* 29.0*  MCV 95.0 97.4 93.5  PLT 116*  111* 89*    Cardiac Enzymes: No results for input(s): CKTOTAL, CKMB, CKMBINDEX, TROPONINI in the last 168 hours.  BNP (last 3 results) No results for input(s): BNP in the last 8760 hours.  ProBNP (last 3 results) No results for input(s): PROBNP in the last 8760 hours.  Radiological Exams: Dg Chest Port 1 View  Result Date: 06/19/2018 CLINICAL DATA:  Pleural effusion EXAM: PORTABLE CHEST 1 VIEW COMPARISON:  Chest radiograph 06/12/2018 FINDINGS: Tracheostomy tube terminates in the proximal trachea. Monitoring leads overlie the patient. Stable cardiomegaly. Interval removal of enteric tube. Interval increase in size of small layering left pleural effusion with underlying heterogeneous opacities. No pneumothorax. IMPRESSION: Interval increase in size of small layering left effusion with underlying opacities. No pneumothorax. Interval removal enteric tube. Electronically Signed   By: Annia Beltrew  Davis M.D.   On: 06/19/2018 09:09    Assessment/Plan Active Problems:   Acute on chronic respiratory failure with hypoxia (HCC)   Acute metabolic encephalopathy   Cardiac arrest (HCC)   Pleural effusion   Alcohol abuse with alcohol-induced disorder (HCC)   1. Acute on chronic respiratory failure with hypoxia we will continue with capping continue pulmonary toilet secretion management 2. Acute metabolic encephalopathy improved 3. Cardiac arrest rhythm has been stable 4. Pleural effusion improving clinically 5. Alcohol abuse no active withdrawal  I have personally seen and evaluated the patient, evaluated laboratory and imaging results, formulated the assessment and plan and placed orders. The Patient requires high complexity decision making for assessment and support.  Case was discussed on Rounds with the Respiratory Therapy Staff  Allyne Gee, MD [facility] Pulmonary Critical Care Medicine Sleep Medicine

## 2018-06-21 ENCOUNTER — Other Ambulatory Visit (HOSPITAL_COMMUNITY): Payer: Self-pay

## 2018-06-21 DIAGNOSIS — J9621 Acute and chronic respiratory failure with hypoxia: Secondary | ICD-10-CM | POA: Diagnosis not present

## 2018-06-21 DIAGNOSIS — N170 Acute kidney failure with tubular necrosis: Secondary | ICD-10-CM | POA: Diagnosis not present

## 2018-06-21 DIAGNOSIS — F1019 Alcohol abuse with unspecified alcohol-induced disorder: Secondary | ICD-10-CM | POA: Diagnosis not present

## 2018-06-21 DIAGNOSIS — G9341 Metabolic encephalopathy: Secondary | ICD-10-CM | POA: Diagnosis not present

## 2018-06-21 LAB — BASIC METABOLIC PANEL
Anion gap: 7 (ref 5–15)
BUN: 27 mg/dL — AB (ref 6–20)
CO2: 30 mmol/L (ref 22–32)
Calcium: 8.3 mg/dL — ABNORMAL LOW (ref 8.9–10.3)
Chloride: 92 mmol/L — ABNORMAL LOW (ref 98–111)
Creatinine, Ser: 1.35 mg/dL — ABNORMAL HIGH (ref 0.61–1.24)
GFR calc Af Amer: >60 mL/min (ref >60)
GFR calc non Af Amer: 57 mL/min — ABNORMAL LOW (ref >60)
GLUCOSE: 207 mg/dL — AB (ref 70–99)
Potassium: 4.9 mmol/L (ref 3.5–5.1)
Sodium: 129 mmol/L — ABNORMAL LOW (ref 135–145)

## 2018-06-21 LAB — MAGNESIUM: Magnesium: 2.3 mg/dL (ref 1.7–2.4)

## 2018-06-21 LAB — PHOSPHORUS: Phosphorus: 4 mg/dL (ref 2.5–4.6)

## 2018-06-21 NOTE — Progress Notes (Signed)
Pulmonary Critical Care Medicine [facility] [place]   PULMONARY CRITICAL CARE SERVICE  PROGRESS NOTE  Date of Service: 06/21/2018  HAWTHORN PATI  RCV:[id number]  DOB: January 11, 1961   DOA: 05/28/2018  Referring Physician: Carron Curie, MD  HPI: Nathan CORRALES is a 58 y.o. male seen for follow up of Acute on Chronic Respiratory Failure.  Patient is comfortable right now without distress.  Is on capping has been off the vent  Medications: Reviewed on Rounds  Physical Exam:  Vitals: Temperature 98.8 pulse 95 respiratory 19 blood pressure 106/62 saturations 97%  Ventilator Settings currently is off the ventilator capping  . General: Comfortable at this time . Eyes: Grossly normal lids, irises & conjunctiva . ENT: grossly tongue is normal . Neck: no obvious mass . Cardiovascular: S1 S2 normal no gallop . Respiratory: No rhonchi or rales are noted at this time . Abdomen: soft . Skin: no rash seen on limited exam . Musculoskeletal: not rigid . Psychiatric:unable to assess . Neurologic: no seizure no involuntary movements         Lab Data:   Basic Metabolic Panel: Recent Labs  Lab 06/15/18 0800 06/17/18 0725 06/18/18 0446 06/20/18 0758 06/21/18 0522  NA 139 142 134* 130* 129*  K 4.3 4.7 4.5 4.9 4.9  CL 98 98 94* 91* 92*  CO2 32 35* 32 29 30  GLUCOSE 151* 152* 174* 179* 207*  BUN 23* 25* 26* 27* 27*  CREATININE 1.20 1.18 1.25* 1.23 1.35*  CALCIUM 8.7* 8.9 8.1* 8.3* 8.3*  MG  --  1.6* 1.7 1.5* 2.3  PHOS  --  4.4  --  3.7 4.0    ABG: No results for input(s): PHART, PCO2ART, PO2ART, HCO3, O2SAT in the last 168 hours.  Liver Function Tests: Recent Labs  Lab 06/15/18 0800  AST 28  ALT 14  ALKPHOS 113  BILITOT 1.2  PROT 6.4*  ALBUMIN 2.0*   No results for input(s): LIPASE, AMYLASE in the last 168 hours. Recent Labs  Lab 06/15/18 0800 06/17/18 0725  AMMONIA 52* 29    CBC: Recent Labs  Lab 06/15/18 0800 06/17/18 0725 06/20/18 0758   WBC 4.3 4.2 4.0  HGB 9.9* 10.1* 9.2*  HCT 30.7* 33.2* 29.0*  MCV 95.0 97.4 93.5  PLT 116* 111* 89*    Cardiac Enzymes: No results for input(s): CKTOTAL, CKMB, CKMBINDEX, TROPONINI in the last 168 hours.  BNP (last 3 results) No results for input(s): BNP in the last 8760 hours.  ProBNP (last 3 results) No results for input(s): PROBNP in the last 8760 hours.  Radiological Exams: No results found.  Assessment/Plan Active Problems:   Acute on chronic respiratory failure with hypoxia (HCC)   Acute metabolic encephalopathy   Cardiac arrest (HCC)   Pleural effusion   Alcohol abuse with alcohol-induced disorder (HCC)   1. Acute on chronic respiratory failure with hypoxia we will continue with capping trials continue pulmonary toilet secretion management 2. Metabolic encephalopathy grossly unchanged 3. Cardiac arrest rhythm is stable 4. Pleural effusions at baseline 5. Alcohol abuse no active withdrawal noted   I have personally seen and evaluated the patient, evaluated laboratory and imaging results, formulated the assessment and plan and placed orders. The Patient requires high complexity decision making for assessment and support.  Case was discussed on Rounds with the Respiratory Therapy Staff  Yevonne Pax, MD [facility] Pulmonary Critical Care Medicine Sleep Medicine

## 2018-06-22 DIAGNOSIS — G9341 Metabolic encephalopathy: Secondary | ICD-10-CM | POA: Diagnosis not present

## 2018-06-22 DIAGNOSIS — N170 Acute kidney failure with tubular necrosis: Secondary | ICD-10-CM | POA: Diagnosis not present

## 2018-06-22 DIAGNOSIS — F1019 Alcohol abuse with unspecified alcohol-induced disorder: Secondary | ICD-10-CM | POA: Diagnosis not present

## 2018-06-22 DIAGNOSIS — J9621 Acute and chronic respiratory failure with hypoxia: Secondary | ICD-10-CM | POA: Diagnosis not present

## 2018-06-22 LAB — CBC
HEMATOCRIT: 29.2 % — AB (ref 39.0–52.0)
Hemoglobin: 9.2 g/dL — ABNORMAL LOW (ref 13.0–17.0)
MCH: 29.7 pg (ref 26.0–34.0)
MCHC: 31.5 g/dL (ref 30.0–36.0)
MCV: 94.2 fL (ref 80.0–100.0)
Platelets: 106 10*3/uL — ABNORMAL LOW (ref 150–400)
RBC: 3.1 MIL/uL — ABNORMAL LOW (ref 4.22–5.81)
RDW: 15.6 % — ABNORMAL HIGH (ref 11.5–15.5)
WBC: 5.3 10*3/uL (ref 4.0–10.5)
nRBC: 0 % (ref 0.0–0.2)

## 2018-06-22 LAB — BASIC METABOLIC PANEL
Anion gap: 9 (ref 5–15)
BUN: 32 mg/dL — ABNORMAL HIGH (ref 6–20)
CO2: 26 mmol/L (ref 22–32)
Calcium: 8.3 mg/dL — ABNORMAL LOW (ref 8.9–10.3)
Chloride: 94 mmol/L — ABNORMAL LOW (ref 98–111)
Creatinine, Ser: 1.21 mg/dL (ref 0.61–1.24)
GFR calc Af Amer: >60 mL/min (ref >60)
GFR calc non Af Amer: >60 mL/min (ref >60)
Glucose, Bld: 206 mg/dL — ABNORMAL HIGH (ref 70–99)
Potassium: 5.2 mmol/L — ABNORMAL HIGH (ref 3.5–5.1)
Sodium: 129 mmol/L — ABNORMAL LOW (ref 135–145)

## 2018-06-22 LAB — HEPARIN INDUCED PLATELET AB (HIT ANTIBODY): Heparin Induced Plt Ab: 0.196 OD (ref 0.000–0.400)

## 2018-06-22 LAB — MAGNESIUM: Magnesium: 1.8 mg/dL (ref 1.7–2.4)

## 2018-06-22 NOTE — Progress Notes (Signed)
Pulmonary Critical Care Medicine [facility] [place]   PULMONARY CRITICAL CARE SERVICE  PROGRESS NOTE  Date of Service: 06/22/2018  Nathan Short  BUL:[id number]  DOB: December 06, 1960   DOA: Jun 21, 2018  Referring Physician: Carron Curie, MD  HPI: Nathan Short is a 58 y.o. male seen for follow up of Acute on Chronic Respiratory Failure.  Patient is capping on room air supposed to have a thoracentesis done today so we will hold off on any decannulation attempt  Medications: Reviewed on Rounds  Physical Exam:  Vitals: Temperature 97.8 pulse 100 respiratory 20 blood pressure 96/48 saturation 95%  Ventilator Settings capping on room air  . General: Comfortable at this time . Eyes: Grossly normal lids, irises & conjunctiva . ENT: grossly tongue is normal . Neck: no obvious mass . Cardiovascular: S1 S2 normal no gallop . Respiratory: No rhonchi or rales are noted at this time . Abdomen: soft . Skin: no rash seen on limited exam . Musculoskeletal: not rigid . Psychiatric:unable to assess . Neurologic: no seizure no involuntary movements         Lab Data:   Basic Metabolic Panel: Recent Labs  Lab 06/17/18 0725 06/18/18 0446 06/20/18 0758 06/21/18 0522 06/22/18 0600  NA 142 134* 130* 129* 129*  K 4.7 4.5 4.9 4.9 5.2*  CL 98 94* 91* 92* 94*  CO2 35* 32 29 30 26   GLUCOSE 152* 174* 179* 207* 206*  BUN 25* 26* 27* 27* 32*  CREATININE 1.18 1.25* 1.23 1.35* 1.21  CALCIUM 8.9 8.1* 8.3* 8.3* 8.3*  MG 1.6* 1.7 1.5* 2.3 1.8  PHOS 4.4  --  3.7 4.0  --     ABG: No results for input(s): PHART, PCO2ART, PO2ART, HCO3, O2SAT in the last 168 hours.  Liver Function Tests: No results for input(s): AST, ALT, ALKPHOS, BILITOT, PROT, ALBUMIN in the last 168 hours. No results for input(s): LIPASE, AMYLASE in the last 168 hours. Recent Labs  Lab 06/17/18 0725  AMMONIA 29    CBC: Recent Labs  Lab 06/17/18 0725 06/20/18 0758 06/22/18 0600  WBC 4.2 4.0 5.3  HGB  10.1* 9.2* 9.2*  HCT 33.2* 29.0* 29.2*  MCV 97.4 93.5 94.2  PLT 111* 89* 106*    Cardiac Enzymes: No results for input(s): CKTOTAL, CKMB, CKMBINDEX, TROPONINI in the last 168 hours.  BNP (last 3 results) No results for input(s): BNP in the last 8760 hours.  ProBNP (last 3 results) No results for input(s): PROBNP in the last 8760 hours.  Radiological Exams: Dg Chest Port 1 View  Result Date: 06/21/2018 CLINICAL DATA:  Chronic ventilator dependent respiratory failure. Follow-up LEFT LOWER LOBE atelectasis and/or pneumonia. EXAM: PORTABLE CHEST 1 VIEW COMPARISON:  06/19/2018 and earlier. FINDINGS: Tracheostomy tube tip in satisfactory position below the thoracic inlet. Cardiac silhouette upper normal in size for AP portable technique. Dense consolidation involving the LEFT LOWER LOBE, unchanged since the examination 2 days ago, associated with a moderate-sized LEFT pleural effusion which is also unchanged. No new pulmonary parenchymal abnormalities. IMPRESSION: Stable dense LEFT LOWER LOBE atelectasis and pneumonia and moderate-sized LEFT pleural effusion since the examination 2 days ago. No new abnormalities. Electronically Signed   By: Hulan Saas M.D.   On: 06/21/2018 20:41    Assessment/Plan Active Problems:   Acute on chronic respiratory failure with hypoxia (HCC)   Acute metabolic encephalopathy   Cardiac arrest (HCC)   Pleural effusion   Alcohol abuse with alcohol-induced disorder (HCC)   1. Acute on chronic respiratory  failure with hypoxia we will continue with the capping.  Working towards decannulation soon 2. Acute metabolic encephalopathy grossly unchanged continue to monitor 3. Cardiac arrest rhythm has been stable 4. Pleural effusion for thoracentesis 5. Alcohol abuse no active withdrawal is noted   I have personally seen and evaluated the patient, evaluated laboratory and imaging results, formulated the assessment and plan and placed orders. The Patient requires  high complexity decision making for assessment and support.  Case was discussed on Rounds with the Respiratory Therapy Staff  Yevonne PaxSaadat A Retal Tonkinson, MD [facility] Pulmonary Critical Care Medicine Sleep Medicine

## 2018-06-23 ENCOUNTER — Other Ambulatory Visit (HOSPITAL_COMMUNITY): Payer: Medicare Other

## 2018-06-23 ENCOUNTER — Other Ambulatory Visit (HOSPITAL_COMMUNITY): Payer: Self-pay

## 2018-06-23 DIAGNOSIS — N170 Acute kidney failure with tubular necrosis: Secondary | ICD-10-CM | POA: Diagnosis not present

## 2018-06-23 DIAGNOSIS — G9341 Metabolic encephalopathy: Secondary | ICD-10-CM | POA: Diagnosis not present

## 2018-06-23 DIAGNOSIS — F1019 Alcohol abuse with unspecified alcohol-induced disorder: Secondary | ICD-10-CM | POA: Diagnosis not present

## 2018-06-23 DIAGNOSIS — J9621 Acute and chronic respiratory failure with hypoxia: Secondary | ICD-10-CM | POA: Diagnosis not present

## 2018-06-23 LAB — BASIC METABOLIC PANEL
ANION GAP: 12 (ref 5–15)
BUN: 30 mg/dL — ABNORMAL HIGH (ref 6–20)
CO2: 22 mmol/L (ref 22–32)
Calcium: 8.2 mg/dL — ABNORMAL LOW (ref 8.9–10.3)
Chloride: 93 mmol/L — ABNORMAL LOW (ref 98–111)
Creatinine, Ser: 1.23 mg/dL (ref 0.61–1.24)
GFR calc Af Amer: >60 mL/min (ref >60)
GFR calc non Af Amer: >60 mL/min (ref >60)
GLUCOSE: 286 mg/dL — AB (ref 70–99)
Potassium: 4.9 mmol/L (ref 3.5–5.1)
Sodium: 127 mmol/L — ABNORMAL LOW (ref 135–145)

## 2018-06-23 LAB — PROTEIN, PLEURAL OR PERITONEAL FLUID: Total protein, fluid: <3 g/dL

## 2018-06-23 LAB — BODY FLUID CELL COUNT WITH DIFFERENTIAL
Eos, Fluid: 0 %
Lymphs, Fluid: 51 %
Monocyte-Macrophage-Serous Fluid: 40 % — ABNORMAL LOW (ref 50–90)
Neutrophil Count, Fluid: 8 % (ref 0–25)
Total Nucleated Cell Count, Fluid: 197 cu mm (ref 0–1000)

## 2018-06-23 LAB — GRAM STAIN

## 2018-06-23 LAB — GLUCOSE, PLEURAL OR PERITONEAL FLUID: Glucose, Fluid: 262 mg/dL

## 2018-06-23 MED ORDER — LIDOCAINE HCL (PF) 1 % IJ SOLN
INTRAMUSCULAR | Status: AC
  Filled 2018-06-23: qty 30

## 2018-06-23 NOTE — Progress Notes (Signed)
[facility] Critical [facility] [place]   PULMONARY CRITICAL CARE SERVICE  PROGRESS NOTE  Date of Service: 06/23/2018  Nathan Short  ZOX:[id number]  DOB: Nov 20, 1960   DOA: 05/19/2018  Referring Physician: Carron CurieAli Hijazi, MD  HPI: Nathan Short is a 58 y.o. male seen for follow up of Acute on Chronic Respiratory Failure.  At this time patient is resting comfortably without distress has been on room air capping  Medications: Reviewed on Rounds  Physical Exam:  Vitals: Temperature 97.6 pulse 109 respiratory 22 blood pressure 124/74 saturations 95%  Ventilator Settings capping off the ventilator on room air  . General: Comfortable at this time . Eyes: Grossly normal lids, irises & conjunctiva . ENT: grossly tongue is normal . Neck: no obvious mass . Cardiovascular: S1 S2 normal no gallop . Respiratory: No rhonchi or rales are noted . Abdomen: soft . Skin: no rash seen on limited exam . Musculoskeletal: not rigid . Psychiatric:unable to assess . Neurologic: no seizure no involuntary movements         Lab Data:   Basic Metabolic Panel: Recent Labs  Lab 06/17/18 0725 06/18/18 0446 06/20/18 0758 06/21/18 0522 06/22/18 0600 06/23/18 0915  NA 142 134* 130* 129* 129* 127*  K 4.7 4.5 4.9 4.9 5.2* 4.9  CL 98 94* 91* 92* 94* 93*  CO2 35* 32 29 30 26 22   GLUCOSE 152* 174* 179* 207* 206* 286*  BUN 25* 26* 27* 27* 32* 30*  CREATININE 1.18 1.25* 1.23 1.35* 1.21 1.23  CALCIUM 8.9 8.1* 8.3* 8.3* 8.3* 8.2*  MG 1.6* 1.7 1.5* 2.3 1.8  --   PHOS 4.4  --  3.7 4.0  --   --     ABG: No results for input(s): PHART, PCO2ART, PO2ART, HCO3, O2SAT in the last 168 hours.  Liver Function Tests: No results for input(s): AST, ALT, ALKPHOS, BILITOT, PROT, ALBUMIN in the last 168 hours. No results for input(s): LIPASE, AMYLASE in the last 168 hours. Recent Labs  Lab 06/17/18 0725  AMMONIA 29    CBC: Recent Labs  Lab 06/17/18 0725 06/20/18 0758  06/22/18 0600  WBC 4.2 4.0 5.3  HGB 10.1* 9.2* 9.2*  HCT 33.2* 29.0* 29.2*  MCV 97.4 93.5 94.2  PLT 111* 89* 106*    Cardiac Enzymes: No results for input(s): CKTOTAL, CKMB, CKMBINDEX, TROPONINI in the last 168 hours.  BNP (last 3 results) No results for input(s): BNP in the last 8760 hours.  ProBNP (last 3 results) No results for input(s): PROBNP in the last 8760 hours.  Radiological Exams: Dg Chest Port 1 View  Result Date: 06/23/2018 CLINICAL DATA:  Status post left thoracentesis. EXAM: PORTABLE CHEST 1 VIEW COMPARISON:  Radiograph of June 21, 2018. FINDINGS: Stable cardiomediastinal silhouette. Tracheostomy tube is in grossly good position. Right lung is clear. No pneumothorax is noted. Left pleural effusion appears to be significantly smaller status post thoracentesis. IMPRESSION: Left pleural effusion appears to be significantly smaller status post thoracentesis. No pneumothorax is noted. Electronically Signed   By: Lupita RaiderJames  Green Jr, M.D.   On: 06/23/2018 10:56   Dg Chest Port 1 View  Result Date: 06/21/2018 CLINICAL DATA:  Chronic ventilator dependent respiratory failure. Follow-up LEFT LOWER LOBE atelectasis and/or pneumonia. EXAM: PORTABLE CHEST 1 VIEW COMPARISON:  06/19/2018 and earlier. FINDINGS: Tracheostomy tube tip in satisfactory position below the thoracic inlet. Cardiac silhouette upper normal in size for AP portable technique. Dense consolidation involving the LEFT LOWER LOBE, unchanged since the examination 2  days ago, associated with a moderate-sized LEFT pleural effusion which is also unchanged. No new pulmonary parenchymal abnormalities. IMPRESSION: Stable dense LEFT LOWER LOBE atelectasis and pneumonia and moderate-sized LEFT pleural effusion since the examination 2 days ago. No new abnormalities. Electronically Signed   By: Hulan Saas M.D.   On: 06/21/2018 20:41    Assessment/Plan Active Problems:   Acute on chronic respiratory failure with hypoxia  (HCC)   Acute metabolic encephalopathy   Cardiac arrest (HCC)   Pleural effusion   Alcohol abuse with alcohol-induced disorder (HCC)   1. Acute on chronic respiratory failure with hypoxia patient is going to have a thoracentesis done today so therefore I am holding off on decannulation 2. Acute metabolic encephalopathy improved we will continue with supportive care 3. Cardiac arrest rhythm is stable 4. Pleural effusions at baseline 5. Alcohol abuse no active withdrawal is noted   I have personally seen and evaluated the patient, evaluated laboratory and imaging results, formulated the assessment and plan and placed orders. The Patient requires high complexity decision making for assessment and support.  Case was discussed on Rounds with the Respiratory Therapy Staff  Yevonne Pax, MD [facility] Pulmonary Critical Care Medicine Sleep Medicine

## 2018-06-23 NOTE — Procedures (Signed)
PROCEDURE SUMMARY:  Successful US guided left thoracentesis. Yielded 1.3 L of clear amber fluid. Pt tolerated procedure well. No immediate complications.  Specimen was sent for labs. CXR ordered.  EBL < 5 mL  Brayton ElKevin Breena Bevacqua PA-C 06/23/2018 10:05 AM

## 2018-06-24 DIAGNOSIS — N170 Acute kidney failure with tubular necrosis: Secondary | ICD-10-CM | POA: Diagnosis not present

## 2018-06-24 DIAGNOSIS — G9341 Metabolic encephalopathy: Secondary | ICD-10-CM | POA: Diagnosis not present

## 2018-06-24 DIAGNOSIS — J9621 Acute and chronic respiratory failure with hypoxia: Secondary | ICD-10-CM | POA: Diagnosis not present

## 2018-06-24 DIAGNOSIS — F1019 Alcohol abuse with unspecified alcohol-induced disorder: Secondary | ICD-10-CM | POA: Diagnosis not present

## 2018-06-24 LAB — BASIC METABOLIC PANEL
Anion gap: 7 (ref 5–15)
BUN: 26 mg/dL — AB (ref 6–20)
CHLORIDE: 97 mmol/L — AB (ref 98–111)
CO2: 27 mmol/L (ref 22–32)
Calcium: 8.5 mg/dL — ABNORMAL LOW (ref 8.9–10.3)
Creatinine, Ser: 0.99 mg/dL (ref 0.61–1.24)
GFR calc Af Amer: >60 mL/min (ref >60)
GFR calc non Af Amer: >60 mL/min (ref >60)
Glucose, Bld: 193 mg/dL — ABNORMAL HIGH (ref 70–99)
Potassium: 4.9 mmol/L (ref 3.5–5.1)
Sodium: 131 mmol/L — ABNORMAL LOW (ref 135–145)

## 2018-06-24 NOTE — Progress Notes (Signed)
Pulmonary Critical Care Medicine [facility] [place]   PULMONARY CRITICAL CARE SERVICE  PROGRESS NOTE  Date of Service: 06/24/2018  Nathan Short  IWO:[id number]  DOB: 1961/04/08   DOA: 05/18/2018  Referring Physician: Carron Curie, MD  HPI: Nathan Short is a 58 y.o. male seen for follow up of Acute on Chronic Respiratory Failure.  He has been capped for several days is doing very well.  We should be able to decannulate today  Medications: Reviewed on Rounds  Physical Exam:  Vitals: Temperature 96.8 pulse 106 respiratory rate 18 blood pressure 114/68 saturations 97%  Ventilator Settings currently off the ventilator capping  . General: Comfortable at this time . Eyes: Grossly normal lids, irises & conjunctiva . ENT: grossly tongue is normal . Neck: no obvious mass . Cardiovascular: S1 S2 normal no gallop . Respiratory: No rhonchi or rales are noted at this time . Abdomen: soft . Skin: no rash seen on limited exam . Musculoskeletal: not rigid . Psychiatric:unable to assess . Neurologic: no seizure no involuntary movements         Lab Data:   Basic Metabolic Panel: Recent Labs  Lab 06/18/18 0446 06/20/18 0758 06/21/18 0522 06/22/18 0600 06/23/18 0915 06/24/18 0757  NA 134* 130* 129* 129* 127* 131*  K 4.5 4.9 4.9 5.2* 4.9 4.9  CL 94* 91* 92* 94* 93* 97*  CO2 32 29 30 26 22 27   GLUCOSE 174* 179* 207* 206* 286* 193*  BUN 26* 27* 27* 32* 30* 26*  CREATININE 1.25* 1.23 1.35* 1.21 1.23 0.99  CALCIUM 8.1* 8.3* 8.3* 8.3* 8.2* 8.5*  MG 1.7 1.5* 2.3 1.8  --   --   PHOS  --  3.7 4.0  --   --   --     ABG: No results for input(s): PHART, PCO2ART, PO2ART, HCO3, O2SAT in the last 168 hours.  Liver Function Tests: No results for input(s): AST, ALT, ALKPHOS, BILITOT, PROT, ALBUMIN in the last 168 hours. No results for input(s): LIPASE, AMYLASE in the last 168 hours. No results for input(s): AMMONIA in the last 168 hours.  CBC: Recent Labs  Lab  06/20/18 0758 06/22/18 0600  WBC 4.0 5.3  HGB 9.2* 9.2*  HCT 29.0* 29.2*  MCV 93.5 94.2  PLT 89* 106*    Cardiac Enzymes: No results for input(s): CKTOTAL, CKMB, CKMBINDEX, TROPONINI in the last 168 hours.  BNP (last 3 results) No results for input(s): BNP in the last 8760 hours.  ProBNP (last 3 results) No results for input(s): PROBNP in the last 8760 hours.  Radiological Exams: Dg Chest Port 1 View  Result Date: 06/23/2018 CLINICAL DATA:  Status post left thoracentesis. EXAM: PORTABLE CHEST 1 VIEW COMPARISON:  Radiograph of June 21, 2018. FINDINGS: Stable cardiomediastinal silhouette. Tracheostomy tube is in grossly good position. Right lung is clear. No pneumothorax is noted. Left pleural effusion appears to be significantly smaller status post thoracentesis. IMPRESSION: Left pleural effusion appears to be significantly smaller status post thoracentesis. No pneumothorax is noted. Electronically Signed   By: Lupita Raider, M.D.   On: 06/23/2018 10:56   US Thoracentesis Asp Pleural Space W/img Guide  Result Date: 06/23/2018 INDICATION: Acute on chronic respiratory failure. Recurrent left pleural effusion. Request for diagnostic and therapeutic thoracentesis. EXAM: ULTRASOUND GUIDED LEFT THORACENTESIS MEDICATIONS: None. COMPLICATIONS: None immediate. Postprocedural chest x-ray negative for pneumothorax. PROCEDURE: An ultrasound guided thoracentesis was thoroughly discussed with the patient and questions answered. The benefits, risks, alternatives and complications were also  discussed. The patient understands and wishes to proceed with the procedure. Written consent was obtained. Ultrasound was performed to localize and mark an adequate pocket of fluid in the left chest. The area was then prepped and draped in the normal sterile fashion. 1% Lidocaine was used for local anesthesia. Under ultrasound guidance a 6 Fr Safe-T-Centesis catheter was introduced. Thoracentesis was performed. The  catheter was removed and a dressing applied. FINDINGS: A total of approximately 1.3 L of clear yellow fluid was removed. Samples were sent to the laboratory as requested by the clinical team. IMPRESSION: Successful ultrasound guided left thoracentesis yielding 1.3 L of pleural fluid. Read by: Brayton ElKevin Bruning PA-C Electronically Signed   By: Corlis Leak  Hassell M.D.   On: 06/23/2018 11:54    Assessment/Plan Active Problems:   Acute on chronic respiratory failure with hypoxia (HCC)   Acute metabolic encephalopathy   Cardiac arrest (HCC)   Pleural effusion   Alcohol abuse with alcohol-induced disorder (HCC)   1. Acute on chronic respiratory failure with hypoxia we will continue with full supportive care and proceed to decannulation 2. Acute metabolic encephalopathy is improved 3. Cardiac arrest rhythm is been stable 4. Pleural effusions at baseline 5. Alcohol abuse no withdrawal is noted   I have personally seen and evaluated the patient, evaluated laboratory and imaging results, formulated the assessment and plan and placed orders. The Patient requires high complexity decision making for assessment and support.  Case was discussed on Rounds with the Respiratory Therapy Staff  Yevonne PaxSaadat A , MD [facility] Pulmonary Critical Care Medicine Sleep Medicine

## 2018-06-25 ENCOUNTER — Other Ambulatory Visit (HOSPITAL_COMMUNITY): Payer: Self-pay

## 2018-06-25 DIAGNOSIS — N170 Acute kidney failure with tubular necrosis: Secondary | ICD-10-CM | POA: Diagnosis not present

## 2018-06-25 DIAGNOSIS — F1019 Alcohol abuse with unspecified alcohol-induced disorder: Secondary | ICD-10-CM | POA: Diagnosis not present

## 2018-06-25 DIAGNOSIS — G9341 Metabolic encephalopathy: Secondary | ICD-10-CM | POA: Diagnosis not present

## 2018-06-25 DIAGNOSIS — J9621 Acute and chronic respiratory failure with hypoxia: Secondary | ICD-10-CM | POA: Diagnosis not present

## 2018-06-25 LAB — CBC
HCT: 36.6 % — ABNORMAL LOW (ref 39.0–52.0)
Hemoglobin: 12.4 g/dL — ABNORMAL LOW (ref 13.0–17.0)
MCH: 31 pg (ref 26.0–34.0)
MCHC: 33.9 g/dL (ref 30.0–36.0)
MCV: 91.5 fL (ref 80.0–100.0)
Platelets: UNDETERMINED 10*3/uL (ref 150–400)
RBC: 4 MIL/uL — ABNORMAL LOW (ref 4.22–5.81)
RDW: 15.1 % (ref 11.5–15.5)
WBC: 6.3 10*3/uL (ref 4.0–10.5)
nRBC: 0 % (ref 0.0–0.2)

## 2018-06-25 LAB — AMMONIA: Ammonia: 56 umol/L — ABNORMAL HIGH (ref 9–35)

## 2018-06-25 LAB — BASIC METABOLIC PANEL
Anion gap: 15 (ref 5–15)
BUN: 31 mg/dL — ABNORMAL HIGH (ref 6–20)
CO2: 19 mmol/L — ABNORMAL LOW (ref 22–32)
Calcium: 8.4 mg/dL — ABNORMAL LOW (ref 8.9–10.3)
Chloride: 96 mmol/L — ABNORMAL LOW (ref 98–111)
Creatinine, Ser: 1.28 mg/dL — ABNORMAL HIGH (ref 0.61–1.24)
GFR calc Af Amer: >60 mL/min (ref >60)
GFR calc non Af Amer: >60 mL/min (ref >60)
GLUCOSE: 259 mg/dL — AB (ref 70–99)
Potassium: 4.8 mmol/L (ref 3.5–5.1)
Sodium: 130 mmol/L — ABNORMAL LOW (ref 135–145)

## 2018-06-25 LAB — MAGNESIUM: Magnesium: 1.5 mg/dL — ABNORMAL LOW (ref 1.7–2.4)

## 2018-06-25 NOTE — Progress Notes (Signed)
Pulmonary Critical Care Medicine [facility] [place]   PULMONARY CRITICAL CARE SERVICE  PROGRESS NOTE  Date of Service: 06/25/2018  Nathan Short  TIR:[id number]  DOB: 1960-08-06   DOA: 05/13/2018  Referring Physician: Carron Curie, MD  HPI: Nathan Short is a 58 y.o. male seen for follow up of Acute on Chronic Respiratory Failure.  Patient is decannulated resting comfortably without distress at this time.  He had significant ascites and will be having a paracentesis done  Medications: Reviewed on Rounds  Physical Exam:  Vitals: Temperature 98.0 pulse 112 respiratory 25 blood pressure 121/73 saturation 95%  Ventilator Settings currently is decannulated off the ventilator  . General: Comfortable at this time . Eyes: Grossly normal lids, irises & conjunctiva . ENT: grossly tongue is normal . Neck: no obvious mass . Cardiovascular: S1 S2 normal no gallop . Respiratory: No rhonchi or rales are noted at this time . Abdomen: soft . Skin: no rash seen on limited exam . Musculoskeletal: not rigid . Psychiatric:unable to assess . Neurologic: no seizure no involuntary movements         Lab Data:   Basic Metabolic Panel: Recent Labs  Lab 06/20/18 0758 06/21/18 0522 06/22/18 0600 06/23/18 0915 06/24/18 0757 06/25/18 1002  NA 130* 129* 129* 127* 131* 130*  K 4.9 4.9 5.2* 4.9 4.9 4.8  CL 91* 92* 94* 93* 97* 96*  CO2 29 30 26 22 27  19*  GLUCOSE 179* 207* 206* 286* 193* 259*  BUN 27* 27* 32* 30* 26* 31*  CREATININE 1.23 1.35* 1.21 1.23 0.99 1.28*  CALCIUM 8.3* 8.3* 8.3* 8.2* 8.5* 8.4*  MG 1.5* 2.3 1.8  --   --  1.5*  PHOS 3.7 4.0  --   --   --   --     ABG: No results for input(s): PHART, PCO2ART, PO2ART, HCO3, O2SAT in the last 168 hours.  Liver Function Tests: No results for input(s): AST, ALT, ALKPHOS, BILITOT, PROT, ALBUMIN in the last 168 hours. No results for input(s): LIPASE, AMYLASE in the last 168 hours. No results for input(s): AMMONIA in  the last 168 hours.  CBC: Recent Labs  Lab 06/20/18 0758 06/22/18 0600 06/25/18 1002  WBC 4.0 5.3 6.3  HGB 9.2* 9.2* 12.4*  HCT 29.0* 29.2* 36.6*  MCV 93.5 94.2 91.5  PLT 89* 106* PLATELET CLUMPS NOTED ON SMEAR, UNABLE TO ESTIMATE    Cardiac Enzymes: No results for input(s): CKTOTAL, CKMB, CKMBINDEX, TROPONINI in the last 168 hours.  BNP (last 3 results) No results for input(s): BNP in the last 8760 hours.  ProBNP (last 3 results) No results for input(s): PROBNP in the last 8760 hours.  Radiological Exams: [place] Ascites (abdomen Limited)  Result Date: 06/25/2018 CLINICAL DATA:  Ascites EXAM: LIMITED ABDOMEN ULTRASOUND FOR ASCITES TECHNIQUE: Limited ultrasound survey for ascites was performed in all four abdominal quadrants. COMPARISON:  06/13/2018 FINDINGS: Large volume ascites present in the 4 quadrants. IMPRESSION: Large volume ascites. Electronically Signed   By: Ulyses Southward M.D.   On: 06/25/2018 13:11    Assessment/Plan Active Problems:   Acute on chronic respiratory failure with hypoxia (HCC)   Acute metabolic encephalopathy   Cardiac arrest (HCC)   Pleural effusion   Alcohol abuse with alcohol-induced disorder (HCC)   1. Acute on chronic respiratory failure with hypoxia we will continue with supportive care monitor his airway stoma appears to be closing 2. Acute metabolic encephalopathy improving 3. Cardiac arrest rhythm is stable 4. Pleural effusion need to  monitor likely from ascites as above 5. Alcohol abuse but no active withdrawal 6. Ascites patient will need paracentesis for drainage   I have personally seen and evaluated the patient, evaluated laboratory and imaging results, formulated the assessment and plan and placed orders. The Patient requires high complexity decision making for assessment and support.  Case was discussed on Rounds with the Respiratory Therapy Staff  Yevonne Pax, MD [facility] Pulmonary Critical Care Medicine Sleep Medicine

## 2018-06-26 ENCOUNTER — Other Ambulatory Visit (HOSPITAL_COMMUNITY): Payer: Self-pay

## 2018-06-26 DIAGNOSIS — G9341 Metabolic encephalopathy: Secondary | ICD-10-CM | POA: Diagnosis not present

## 2018-06-26 DIAGNOSIS — F1019 Alcohol abuse with unspecified alcohol-induced disorder: Secondary | ICD-10-CM | POA: Diagnosis not present

## 2018-06-26 DIAGNOSIS — J9621 Acute and chronic respiratory failure with hypoxia: Secondary | ICD-10-CM | POA: Diagnosis not present

## 2018-06-26 DIAGNOSIS — N170 Acute kidney failure with tubular necrosis: Secondary | ICD-10-CM | POA: Diagnosis not present

## 2018-06-26 LAB — CBC
HEMATOCRIT: 37.2 % — AB (ref 39.0–52.0)
Hemoglobin: 12.1 g/dL — ABNORMAL LOW (ref 13.0–17.0)
MCH: 30.6 pg (ref 26.0–34.0)
MCHC: 32.5 g/dL (ref 30.0–36.0)
MCV: 94.2 fL (ref 80.0–100.0)
Platelets: 230 10*3/uL (ref 150–400)
RBC: 3.95 MIL/uL — ABNORMAL LOW (ref 4.22–5.81)
RDW: 15.5 % (ref 11.5–15.5)
WBC: 15.2 10*3/uL — ABNORMAL HIGH (ref 4.0–10.5)
nRBC: 0 % (ref 0.0–0.2)

## 2018-06-26 LAB — MAGNESIUM: Magnesium: 2.1 mg/dL (ref 1.7–2.4)

## 2018-06-26 LAB — COMPREHENSIVE METABOLIC PANEL
ALT: 212 U/L — ABNORMAL HIGH (ref 0–44)
AST: 277 U/L — ABNORMAL HIGH (ref 15–41)
Albumin: 1.9 g/dL — ABNORMAL LOW (ref 3.5–5.0)
Alkaline Phosphatase: 147 U/L — ABNORMAL HIGH (ref 38–126)
Anion gap: 18 — ABNORMAL HIGH (ref 5–15)
BUN: 47 mg/dL — ABNORMAL HIGH (ref 6–20)
CO2: 16 mmol/L — ABNORMAL LOW (ref 22–32)
Calcium: 8.6 mg/dL — ABNORMAL LOW (ref 8.9–10.3)
Chloride: 94 mmol/L — ABNORMAL LOW (ref 98–111)
Creatinine, Ser: 2.79 mg/dL — ABNORMAL HIGH (ref 0.61–1.24)
GFR calc Af Amer: 28 mL/min — ABNORMAL LOW (ref >60)
GFR calc non Af Amer: 24 mL/min — ABNORMAL LOW (ref >60)
Glucose, Bld: 253 mg/dL — ABNORMAL HIGH (ref 70–99)
Potassium: 6 mmol/L — ABNORMAL HIGH (ref 3.5–5.1)
Sodium: 128 mmol/L — ABNORMAL LOW (ref 135–145)
Total Bilirubin: 2.4 mg/dL — ABNORMAL HIGH (ref 0.3–1.2)
Total Protein: 6.6 g/dL (ref 6.5–8.1)

## 2018-06-26 LAB — AMMONIA: Ammonia: 71 umol/L — ABNORMAL HIGH (ref 9–35)

## 2018-06-26 MED ORDER — LIDOCAINE HCL (PF) 1 % IJ SOLN
INTRAMUSCULAR | Status: AC
  Filled 2018-06-26: qty 30

## 2018-06-26 NOTE — Progress Notes (Signed)
Pulmonary Critical Care Medicine [facility] [place]   PULMONARY CRITICAL CARE SERVICE  PROGRESS NOTE  Date of Service: 06/26/2018  Nathan Short  HBZ:[id number]  DOB: 10-12-1960   DOA: 06/21/2018  Referring Physician: Carron Curie, MD  HPI: Nathan Short is a 58 y.o. male seen for follow up of Acute on Chronic Respiratory Failure.  Patient is decannulated comfortable right now without distress patient is supposed to have another paracentesis done today due to reaccumulation of fluid  Medications: Reviewed on Rounds  Physical Exam:  Vitals: Temperature 97.4 pulse 113 respiratory 27 blood pressure 99/63 saturations 96%  Ventilator Settings off the ventilator  . General: Comfortable at this time . Eyes: Grossly normal lids, irises & conjunctiva . ENT: grossly tongue is normal . Neck: no obvious mass . Cardiovascular: S1 S2 normal no gallop . Respiratory: No rhonchi no rales are noted at this time . Abdomen: soft . Skin: no rash seen on limited exam . Musculoskeletal: not rigid . Psychiatric:unable to assess . Neurologic: no seizure no involuntary movements         Lab Data:   Basic Metabolic Panel: Recent Labs  Lab 06/20/18 0758 06/21/18 0522 06/22/18 0600 06/23/18 0915 06/24/18 0757 06/25/18 1002  NA 130* 129* 129* 127* 131* 130*  K 4.9 4.9 5.2* 4.9 4.9 4.8  CL 91* 92* 94* 93* 97* 96*  CO2 29 30 26 22 27  19*  GLUCOSE 179* 207* 206* 286* 193* 259*  BUN 27* 27* 32* 30* 26* 31*  CREATININE 1.23 1.35* 1.21 1.23 0.99 1.28*  CALCIUM 8.3* 8.3* 8.3* 8.2* 8.5* 8.4*  MG 1.5* 2.3 1.8  --   --  1.5*  PHOS 3.7 4.0  --   --   --   --     ABG: No results for input(s): PHART, PCO2ART, PO2ART, HCO3, O2SAT in the last 168 hours.  Liver Function Tests: No results for input(s): AST, ALT, ALKPHOS, BILITOT, PROT, ALBUMIN in the last 168 hours. No results for input(s): LIPASE, AMYLASE in the last 168 hours. Recent Labs  Lab 06/25/18 1833  AMMONIA 56*     CBC: Recent Labs  Lab 06/20/18 0758 06/22/18 0600 06/25/18 1002  WBC 4.0 5.3 6.3  HGB 9.2* 9.2* 12.4*  HCT 29.0* 29.2* 36.6*  MCV 93.5 94.2 91.5  PLT 89* 106* PLATELET CLUMPS NOTED ON SMEAR, UNABLE TO ESTIMATE    Cardiac Enzymes: No results for input(s): CKTOTAL, CKMB, CKMBINDEX, TROPONINI in the last 168 hours.  BNP (last 3 results) No results for input(s): BNP in the last 8760 hours.  ProBNP (last 3 results) No results for input(s): PROBNP in the last 8760 hours.  Radiological Exams: [place] Ascites (abdomen Limited)  Result Date: 06/25/2018 CLINICAL DATA:  Ascites EXAM: LIMITED ABDOMEN ULTRASOUND FOR ASCITES TECHNIQUE: Limited ultrasound survey for ascites was performed in all four abdominal quadrants. COMPARISON:  06/13/2018 FINDINGS: Large volume ascites present in the 4 quadrants. IMPRESSION: Large volume ascites. Electronically Signed   By: Ulyses Southward M.D.   On: 06/25/2018 13:11    Assessment/Plan Active Problems:   Acute on chronic respiratory failure with hypoxia (HCC)   Acute metabolic encephalopathy   Cardiac arrest (HCC)   Pleural effusion   Alcohol abuse with alcohol-induced disorder (HCC)   1. Acute on chronic respiratory failure with hypoxia we will continue with oxygen therapy as necessary 2. Acute metabolic encephalopathy grossly unchanged we will continue with supportive care 3. Cardiac arrest rhythm is stable at this time 4. Pleural effusion  paracentesis will be done today for ascites which is likely causing pleural effusions 5. Alcohol abuse at baseline   I have personally seen and evaluated the patient, evaluated laboratory and imaging results, formulated the assessment and plan and placed orders. The Patient requires high complexity decision making for assessment and support.  Case was discussed on Rounds with the Respiratory Therapy Staff  Yevonne Pax, MD [facility] Pulmonary Critical Care Medicine Sleep Medicine

## 2018-06-26 NOTE — Procedures (Signed)
PROCEDURE SUMMARY:  Successful image-guided paracentesis from the right lower abdomen.  Yielded 9.2 liters of clear yellow fluid.  No immediate complications.  EBL: zero Patient tolerated well.   Specimen was not sent for labs.  Please see imaging section of Epic for full dictation.  Villa HerbShannon A Watterson PA-C 06/26/2018 11:15 AM

## 2018-06-27 LAB — BASIC METABOLIC PANEL
Anion gap: 16 — ABNORMAL HIGH (ref 5–15)
BUN: 69 mg/dL — ABNORMAL HIGH (ref 6–20)
CO2: 18 mmol/L — ABNORMAL LOW (ref 22–32)
Calcium: 8.3 mg/dL — ABNORMAL LOW (ref 8.9–10.3)
Chloride: 93 mmol/L — ABNORMAL LOW (ref 98–111)
Creatinine, Ser: 3.11 mg/dL — ABNORMAL HIGH (ref 0.61–1.24)
GFR calc Af Amer: 24 mL/min — ABNORMAL LOW (ref >60)
GFR calc non Af Amer: 21 mL/min — ABNORMAL LOW (ref >60)
Glucose, Bld: 195 mg/dL — ABNORMAL HIGH (ref 70–99)
Potassium: 6.1 mmol/L — ABNORMAL HIGH (ref 3.5–5.1)
Sodium: 127 mmol/L — ABNORMAL LOW (ref 135–145)

## 2018-06-27 LAB — CBC
HCT: 32.6 % — ABNORMAL LOW (ref 39.0–52.0)
Hemoglobin: 10.7 g/dL — ABNORMAL LOW (ref 13.0–17.0)
MCH: 30.5 pg (ref 26.0–34.0)
MCHC: 32.8 g/dL (ref 30.0–36.0)
MCV: 92.9 fL (ref 80.0–100.0)
PLATELETS: 133 10*3/uL — AB (ref 150–400)
RBC: 3.51 MIL/uL — ABNORMAL LOW (ref 4.22–5.81)
RDW: 15.2 % (ref 11.5–15.5)
WBC: 10.4 10*3/uL (ref 4.0–10.5)
nRBC: 0 % (ref 0.0–0.2)

## 2018-06-28 LAB — CULTURE, BODY FLUID W GRAM STAIN -BOTTLE: Culture: NO GROWTH

## 2018-07-11 DEATH — deceased

## 2019-08-22 IMAGING — DX DG CHEST 1V PORT
1 series · 1 of 1 positions shown · non-contrast
Comparison: 06/19/2018 and earlier.

CLINICAL DATA: Chronic ventilator dependent respiratory failure.
Follow-up LEFT LOWER LOBE atelectasis and/or pneumonia.

EXAM:
PORTABLE CHEST 1 VIEW

[chest ap]
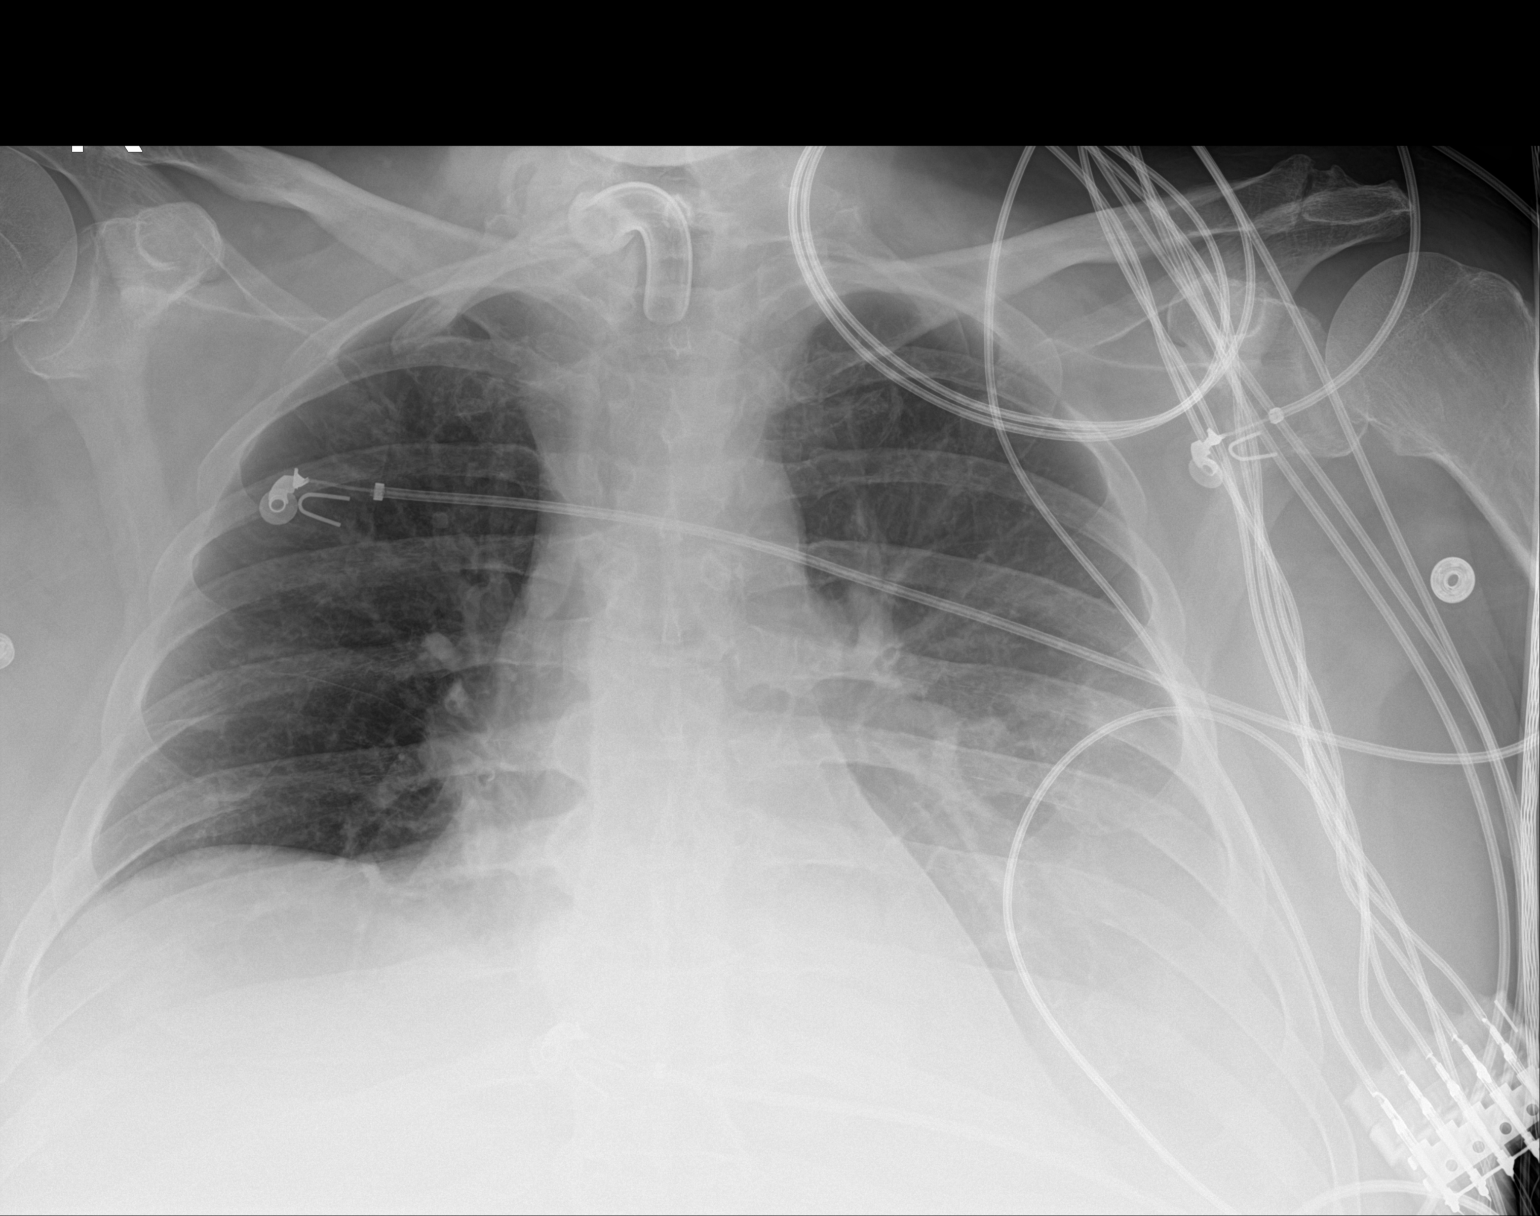

[1 of 1 positions shown; findings below may reference images not displayed]

FINDINGS: Tracheostomy tube tip in satisfactory position below the thoracic
inlet. Cardiac silhouette upper normal in size for AP portable
technique. Dense consolidation involving the LEFT LOWER LOBE,
unchanged since the examination 2 days ago, associated with a
moderate-sized LEFT pleural effusion which is also unchanged. No new
pulmonary parenchymal abnormalities.
IMPRESSION: Stable dense LEFT LOWER LOBE atelectasis and pneumonia and
moderate-sized LEFT pleural effusion since the examination 2 days
ago. No new abnormalities.

## 2019-08-24 IMAGING — US US THORACENTESIS ASP PLEURAL SPACE W/IMG GUIDE
1 series · 4 of 4 positions shown · non-contrast
Comparison: none

INDICATION: Acute on chronic respiratory failure. Recurrent left pleural
effusion. Request for diagnostic and therapeutic thoracentesis.

[Series 1: us thoracentesis asp pleural space w/img guide · 4 of 4 slices shown]
[im 1/4]
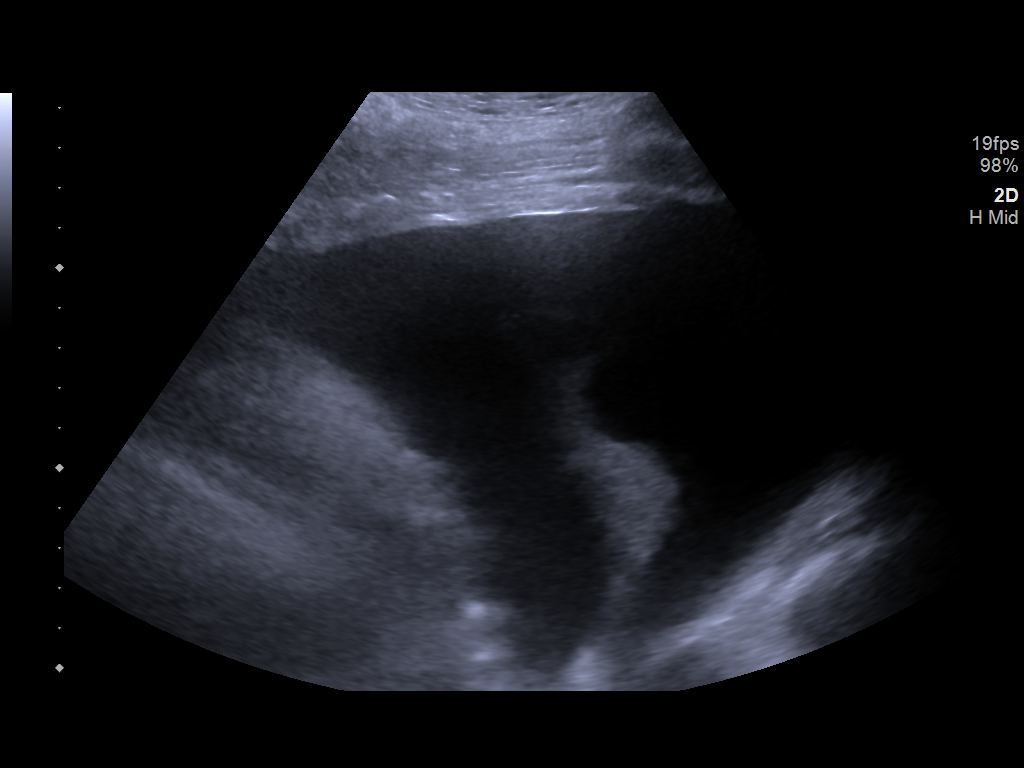
[im 2/4]
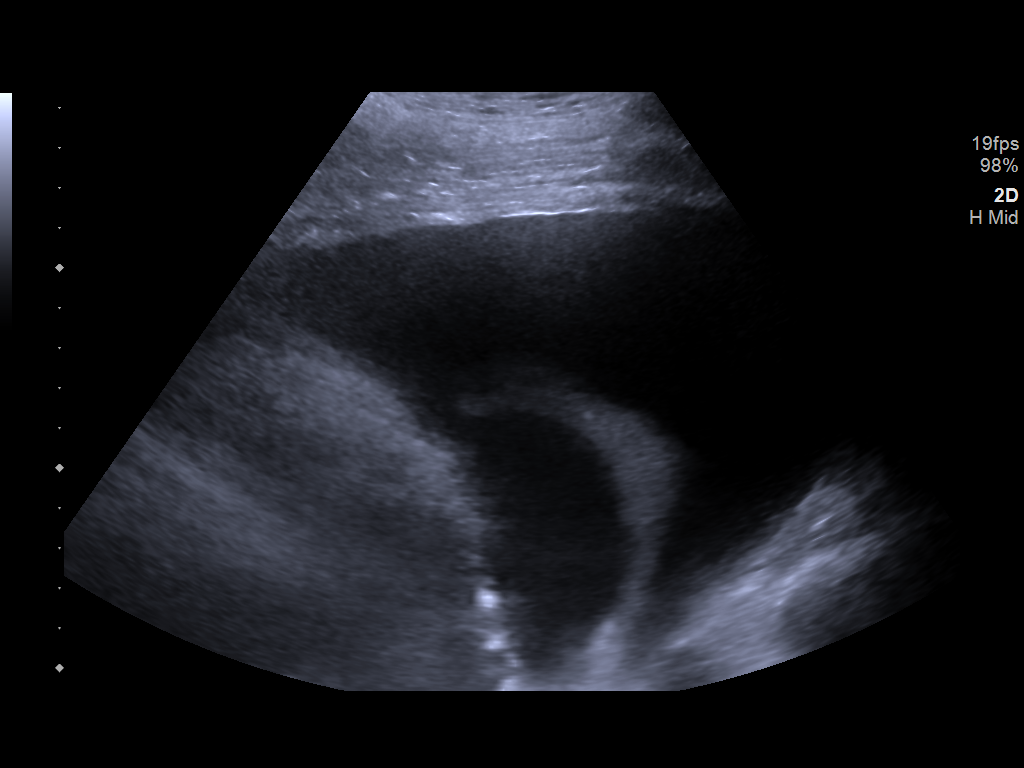
[im 3/4]
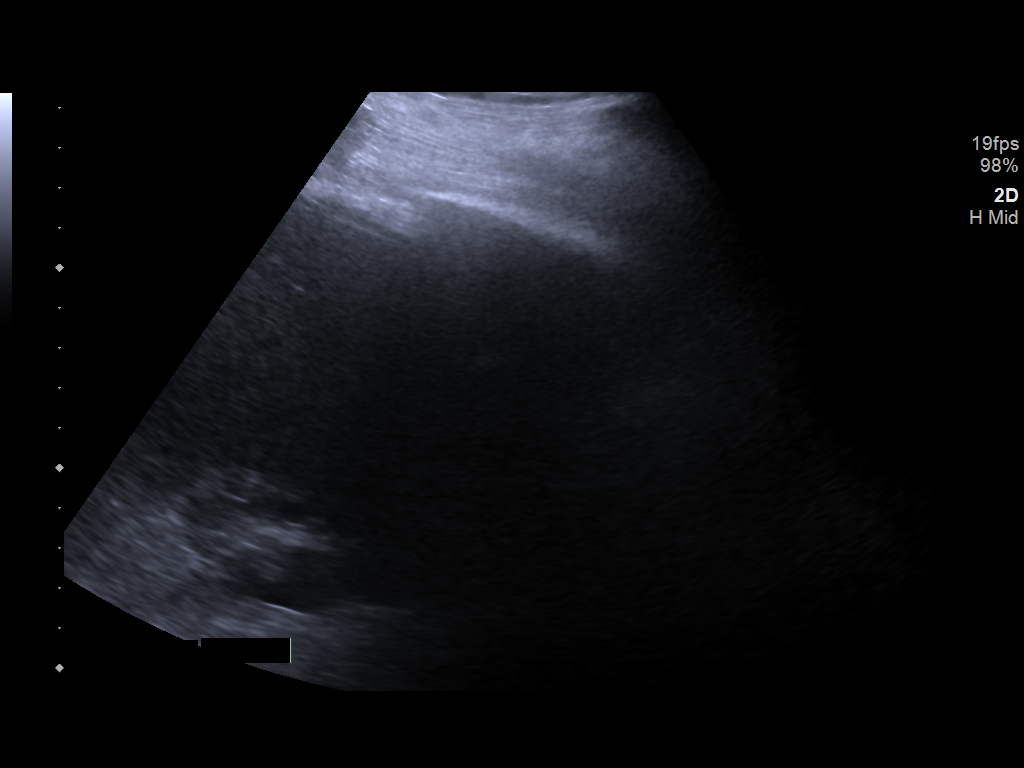
[im 4/4]
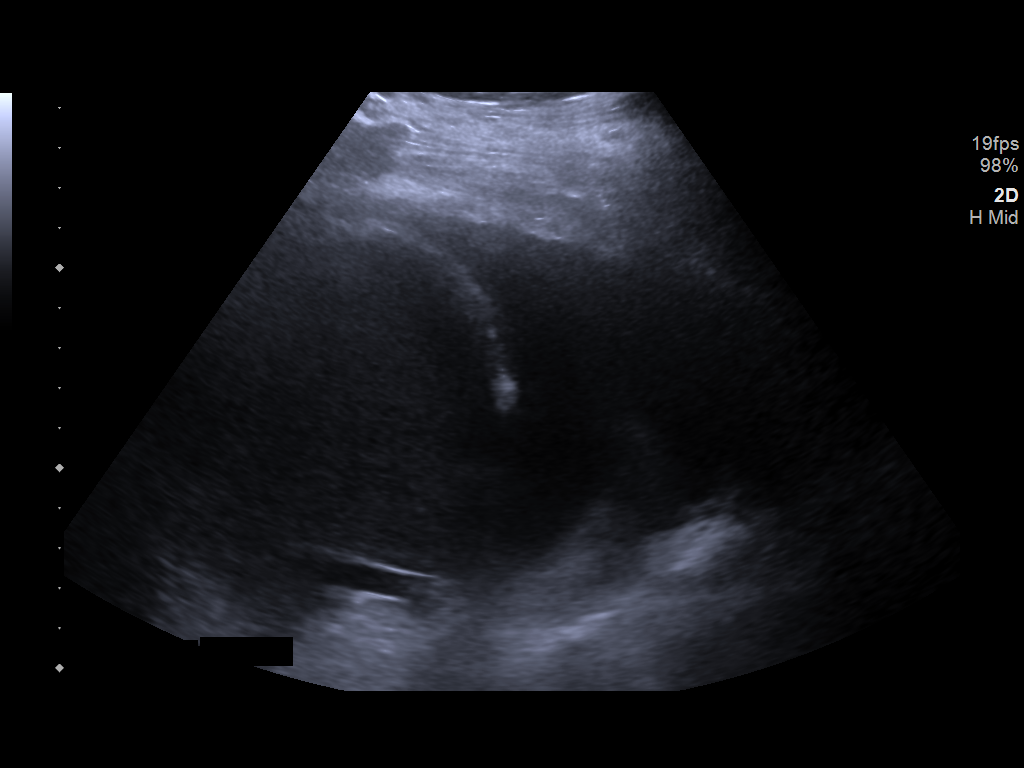

[4 of 4 positions shown; findings below may reference images not displayed]

EXAM:
ULTRASOUND GUIDED LEFT THORACENTESIS

MEDICATIONS:
None.

COMPLICATIONS:
None immediate. Postprocedural chest x-ray negative for
pneumothorax.

PROCEDURE:
An ultrasound guided thoracentesis was thoroughly discussed with the
patient and questions answered. The benefits, risks, alternatives
and complications were also discussed. The patient understands and
wishes to proceed with the procedure. Written consent was obtained.

Ultrasound was performed to localize and mark an adequate pocket of
fluid in the left chest. The area was then prepped and draped in the
normal sterile fashion. 1% Lidocaine was used for local anesthesia.
Under ultrasound guidance a 6 Fr Safe-T-Centesis catheter was
introduced. Thoracentesis was performed. The catheter was removed
and a dressing applied.
FINDINGS: A total of approximately 1.3 L of clear yellow fluid was removed.
Samples were sent to the laboratory as requested by the clinical
team.
IMPRESSION: Successful ultrasound guided left thoracentesis yielding 1.3 L of
pleural fluid.

## 2019-08-24 IMAGING — DX DG CHEST 1V PORT
1 series · 1 of 1 positions shown · non-contrast
Comparison: Radiograph June 21, 2018.

CLINICAL DATA: Status post left thoracentesis.

EXAM:
PORTABLE CHEST 1 VIEW

[chest]
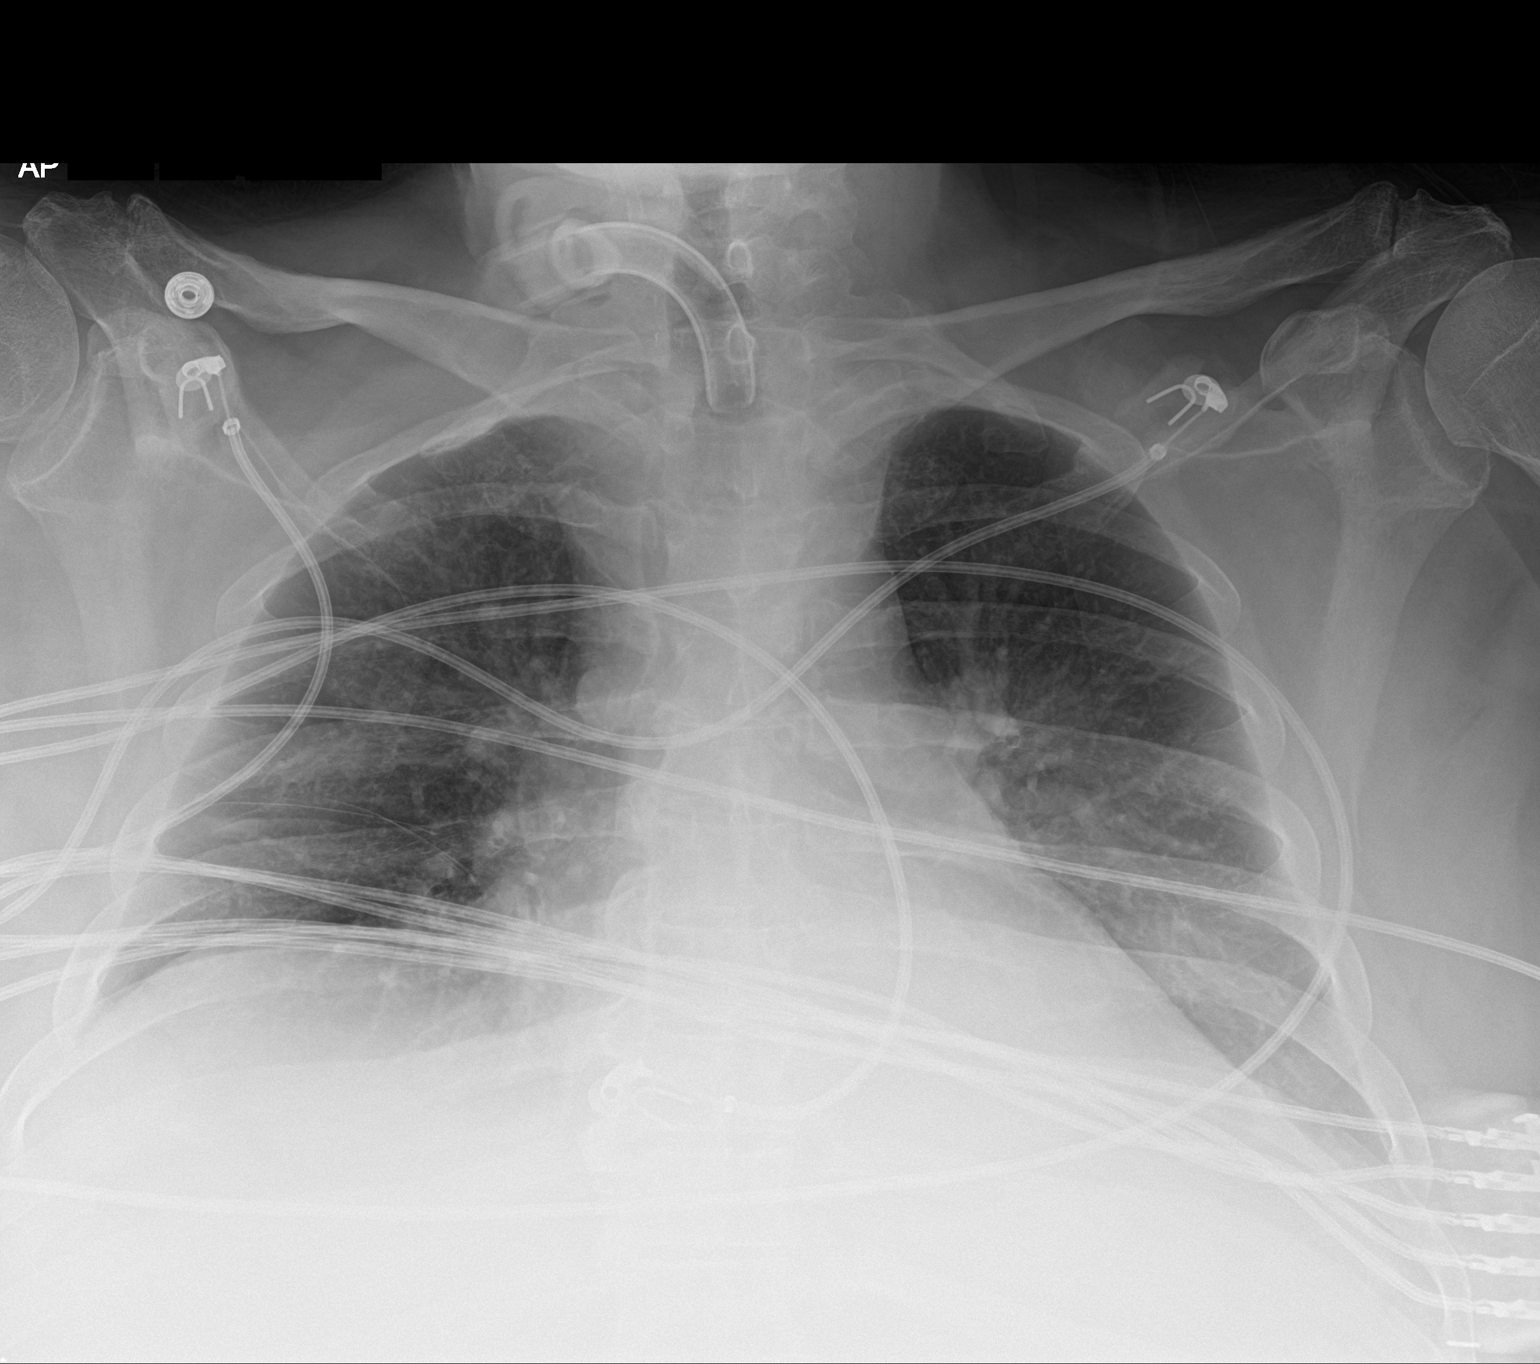

[1 of 1 positions shown; findings below may reference images not displayed]

FINDINGS: Stable cardiomediastinal silhouette. Tracheostomy tube is in grossly
good position. Right lung is clear. No pneumothorax is noted. Left
pleural effusion appears to be significantly smaller status post
thoracentesis.
IMPRESSION: Left pleural effusion appears to be significantly smaller status
post thoracentesis. No pneumothorax is noted.

## 2020-04-12 IMAGING — US US ABDOMEN LIMITED
1 series · 8 of 8 positions shown · non-contrast
Comparison: No prior.

CLINICAL DATA: Alcohol abuse.

EXAM:
LIMITED ABDOMEN ULTRASOUND FOR ASCITES
TECHNIQUE: Limited ultrasound survey for ascites was performed in all four
abdominal quadrants.

[Series 1: us abdomen limited · 0.31mm/px · 8 of 8 slices shown]
[im 1/8]
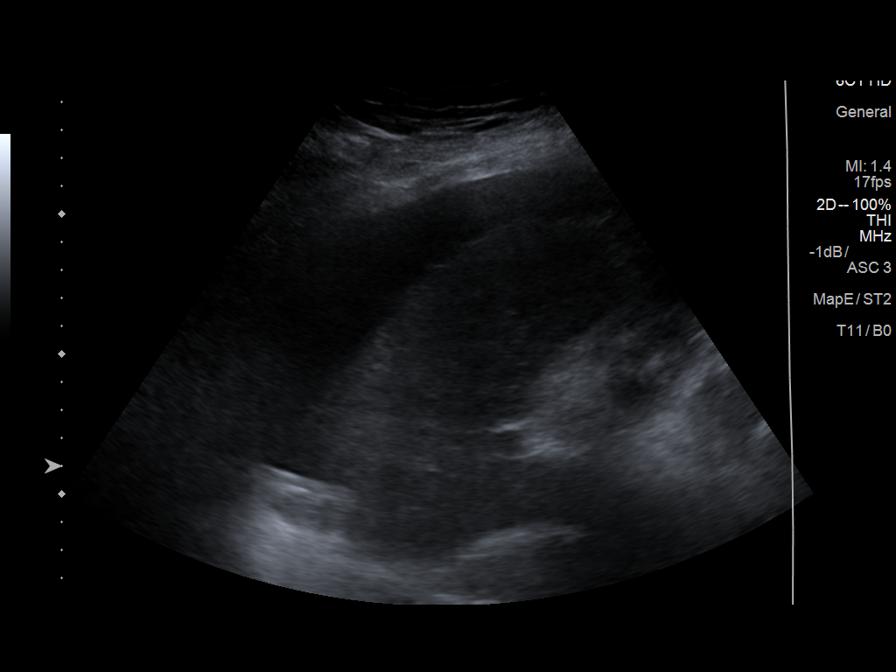
[im 2/8]
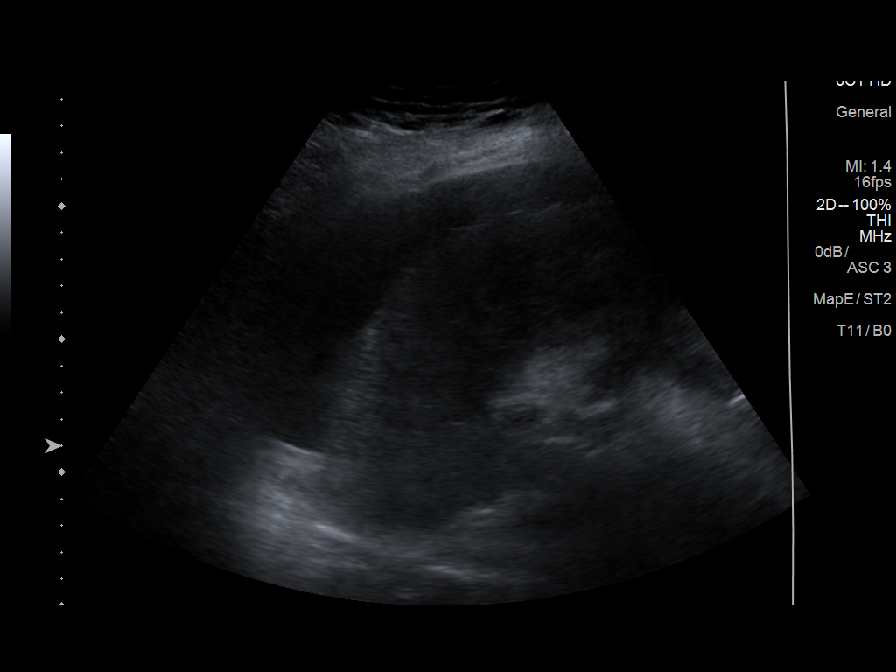
[im 3/8]
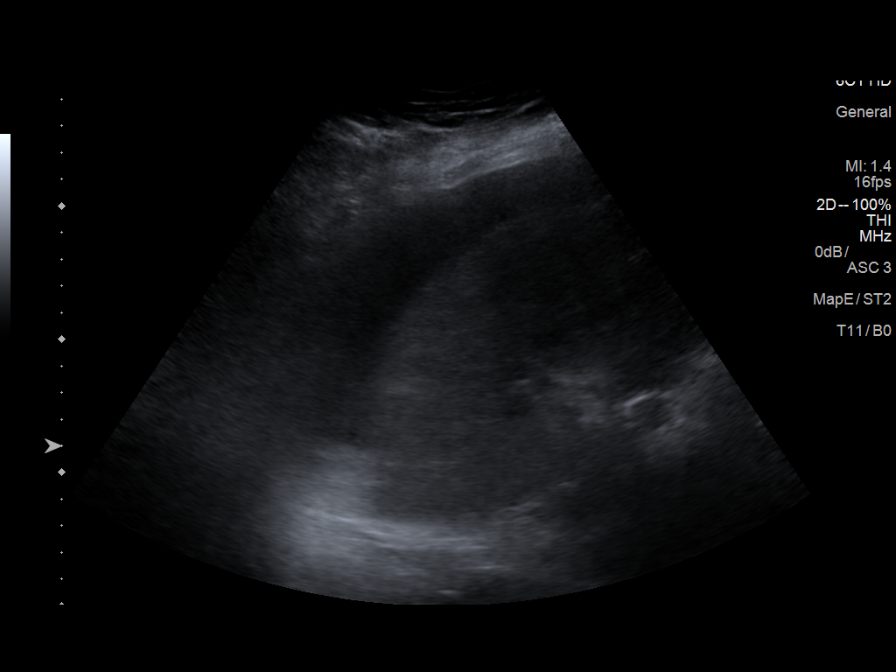
[im 4/8]
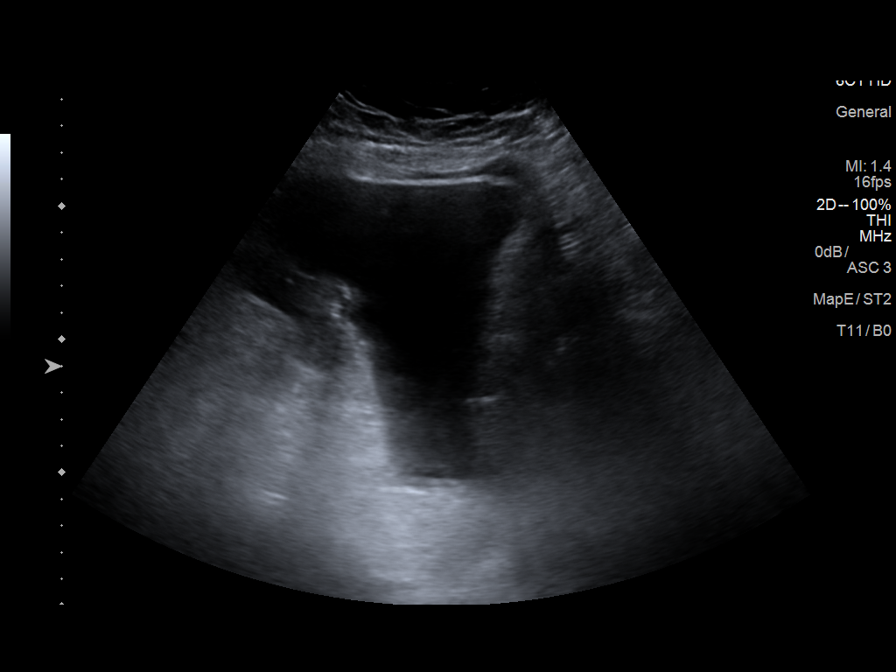
[im 5/8]
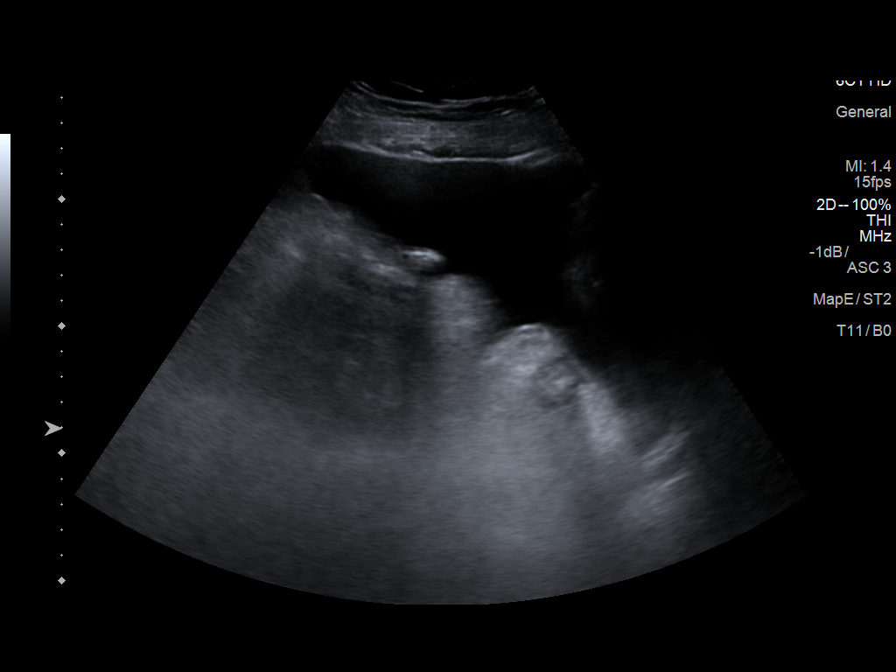
[im 6/8]
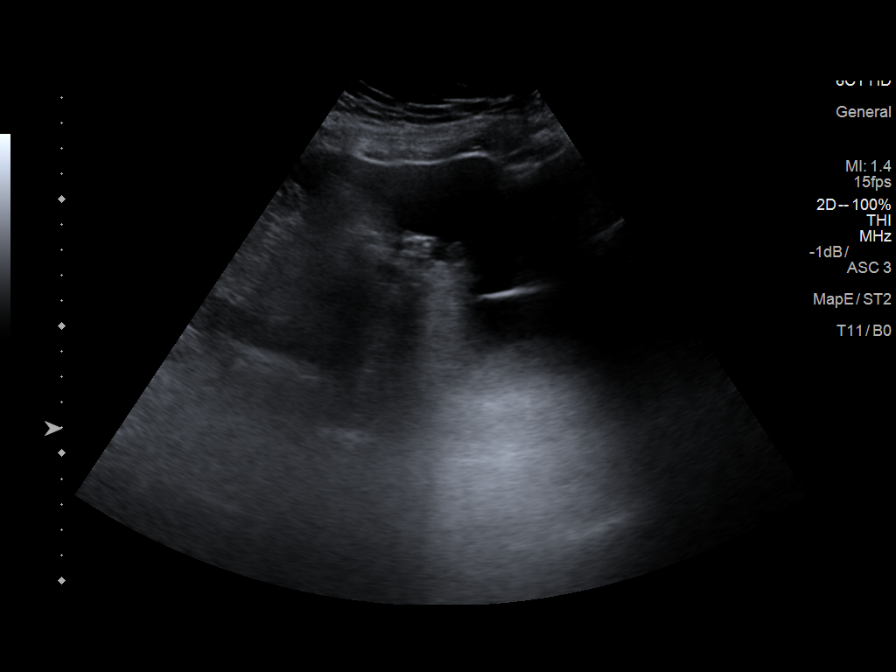
[im 7/8]
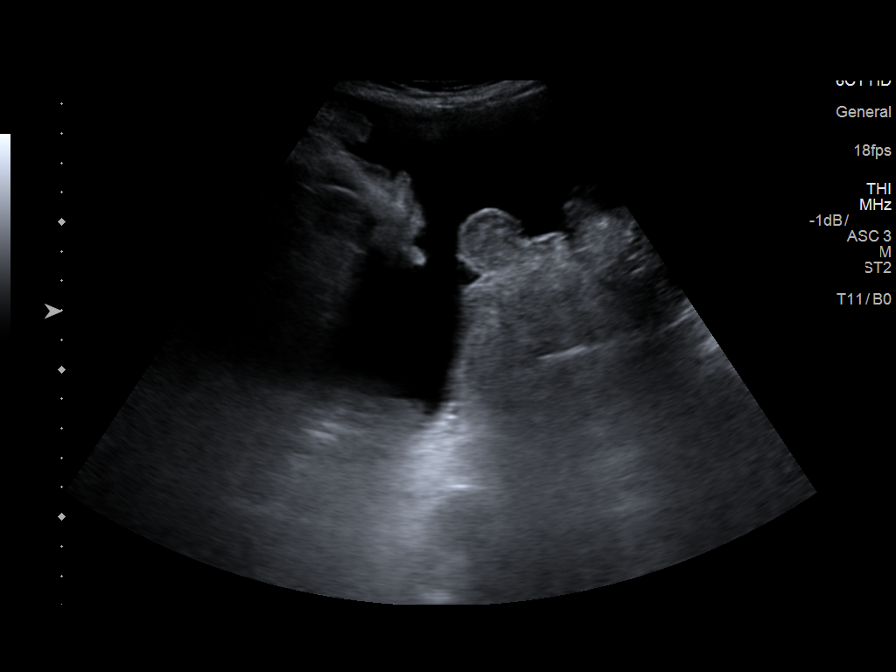
[im 8/8]
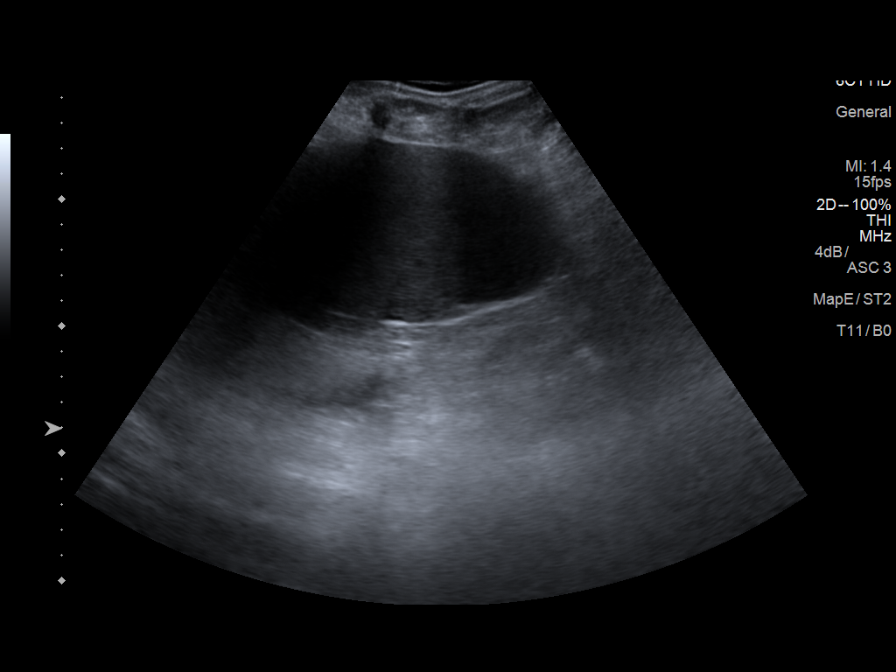

[8 of 8 positions shown; findings below may reference images not displayed]

FINDINGS: Prominent amount of ascites noted.
IMPRESSION: Prominent ascites.
